# Patient Record
Sex: Female | Born: 1990 | Race: Black or African American | Hispanic: No | Marital: Single | Smoking: Former smoker
Health system: Southern US, Community
[De-identification: ages and names within clinical notes are randomized; demographics above are authoritative.]

## PROBLEM LIST (undated history)

## (undated) ENCOUNTER — Inpatient Hospital Stay (HOSPITAL_COMMUNITY): Payer: Self-pay

## (undated) DIAGNOSIS — I10 Essential (primary) hypertension: Secondary | ICD-10-CM

## (undated) DIAGNOSIS — D649 Anemia, unspecified: Secondary | ICD-10-CM

## (undated) DIAGNOSIS — F418 Other specified anxiety disorders: Secondary | ICD-10-CM

## (undated) DIAGNOSIS — A549 Gonococcal infection, unspecified: Secondary | ICD-10-CM

## (undated) DIAGNOSIS — F909 Attention-deficit hyperactivity disorder, unspecified type: Secondary | ICD-10-CM

## (undated) DIAGNOSIS — J45909 Unspecified asthma, uncomplicated: Secondary | ICD-10-CM

## (undated) DIAGNOSIS — F419 Anxiety disorder, unspecified: Secondary | ICD-10-CM

## (undated) DIAGNOSIS — A749 Chlamydial infection, unspecified: Secondary | ICD-10-CM

## (undated) HISTORY — PX: WISDOM TOOTH EXTRACTION: SHX21

---

## 2013-06-25 ENCOUNTER — Emergency Department (HOSPITAL_COMMUNITY): Payer: Medicaid Other

## 2013-06-25 ENCOUNTER — Emergency Department (HOSPITAL_COMMUNITY)
Admission: EM | Admit: 2013-06-25 | Discharge: 2013-06-25 | Disposition: A | Discharge status: Home / Self Care | Payer: Medicaid Other | Attending: Emergency Medicine | Admitting: Emergency Medicine

## 2013-06-25 ENCOUNTER — Encounter (HOSPITAL_COMMUNITY): Payer: Self-pay | Admitting: *Deleted

## 2013-06-25 DIAGNOSIS — Y92009 Unspecified place in unspecified non-institutional (private) residence as the place of occurrence of the external cause: Secondary | ICD-10-CM | POA: Insufficient documentation

## 2013-06-25 DIAGNOSIS — M25522 Pain in left elbow: Secondary | ICD-10-CM

## 2013-06-25 DIAGNOSIS — S59909A Unspecified injury of unspecified elbow, initial encounter: Secondary | ICD-10-CM | POA: Insufficient documentation

## 2013-06-25 DIAGNOSIS — Z79899 Other long term (current) drug therapy: Secondary | ICD-10-CM | POA: Insufficient documentation

## 2013-06-25 DIAGNOSIS — R209 Unspecified disturbances of skin sensation: Secondary | ICD-10-CM | POA: Insufficient documentation

## 2013-06-25 DIAGNOSIS — S6990XA Unspecified injury of unspecified wrist, hand and finger(s), initial encounter: Secondary | ICD-10-CM | POA: Insufficient documentation

## 2013-06-25 DIAGNOSIS — J45909 Unspecified asthma, uncomplicated: Secondary | ICD-10-CM | POA: Insufficient documentation

## 2013-06-25 DIAGNOSIS — Y93E1 Activity, personal bathing and showering: Secondary | ICD-10-CM | POA: Insufficient documentation

## 2013-06-25 DIAGNOSIS — W010XXA Fall on same level from slipping, tripping and stumbling without subsequent striking against object, initial encounter: Secondary | ICD-10-CM | POA: Insufficient documentation

## 2013-06-25 HISTORY — DX: Unspecified asthma, uncomplicated: J45.909

## 2013-06-25 MED ORDER — NAPROXEN 500 MG PO TABS: 500.0000 mg | ORAL_TABLET | Freq: Two times a day (BID) | ORAL | Status: DC

## 2013-06-25 MED ORDER — OXYCODONE-ACETAMINOPHEN 5-325 MG PO TABS
1.0000 | ORAL_TABLET | Freq: Once | ORAL | Status: AC
  Administered 2013-06-25: 1 via ORAL
  Filled 2013-06-25: qty 1

## 2013-06-25 NOTE — ED Provider Notes (Signed)
CSN: [id number]     Arrival date & time 06/25/13  1848 History  This chart was scribed for non-physician practitioner Raymon Mutton, PA-C working with Richardean Canal, MD by Danella Maiers, ED Scribe. This patient was seen in room TR04C/TR04C and the patient's care was started at 7:17 PM.    Chief Complaint  Patient presents with  . Arm Pain  . Fall   Patient is a 22 y.o. female presenting with fall. The history is provided by the patient. No language interpreter was used.  Fall Pertinent negatives include no headaches.   HPI Comments: Kristin Bentley is a 22 y.o. female who presents to the Emergency Department complaining of throbbing left elbow pain that radiates to her wrist after falling and hitting it in the shower. She denies hitting her head or LOC. She reports throbbing pain at rest that worsens with range of motion of the elbow. She reports tingling in her fingers. She denies blurred vision, neck pain, neck stiffness, back pain, headaches, dizziness, nausea, and numbness.   Past Medical History  Diagnosis Date  . Asthma    History reviewed. No pertinent past surgical history. No family history on file. History  Substance Use Topics  . Smoking status: Never Smoker   . Smokeless tobacco: Not on file  . Alcohol Use: No   OB History   Grav Para Term Preterm Abortions TAB SAB Ect Mult Living                 Review of Systems  Musculoskeletal: Positive for arthralgias (left elbow pain/injury).  Neurological: Negative for syncope and headaches.  All other systems reviewed and are negative.  Physical exam: Patient sitting comfortably upright in chair with blanket covered over her Neck supple, full range of motion-negative neck stiffness, negative nuchal rigidity, negative pain upon palpation to cervical spine Eyes normal, PERRLA bilaterally Heart rate and rhythm and normal, radial pulses 2+ bilaterally Lungs clear to auscultation to upper and lower lobes  bilaterally Negative swelling, erythema, inflammation, deformity noted to the left upper extremity. Discomfort upon palpation to the left olecranon process and antecubital fossa region. Negative warmth upon palpation. Negative erythema noted to the left elbow. Negative ecchymosis noted to the left elbow. Decreased flexion and extension at the left elbow secondary to pain. Pain upon palpation to the left humerus. Full range of motion to the left wrist, left digits, left shoulder. Patient able to make a fist. Sensation intact to upper extremities bilaterally with differentiation to sharp and dull touch. Strength 5+/5+ to right upper extremity, mildly decreased on the left upper extremity secondary to discomfort.   Allergies  Citrus  Home Medications   Current Outpatient Rx  Name  Route  Sig  Dispense  Refill  . guanFACINE (INTUNIV) 1 MG TB24   Oral   Take 1 mg by mouth daily.         . norgestimate-ethinyl estradiol (ORTHO-CYCLEN,SPRINTEC,PREVIFEM) 0.25-35 MG-MCG tablet   Oral   Take 1 tablet by mouth daily.         . naproxen (NAPROSYN) 500 MG tablet   Oral   Take 1 tablet (500 mg total) by mouth 2 (two) times daily.   30 tablet   0    BP 137/90  Pulse 115  Temp(Src) 99 F (37.2 C) (Oral)  Resp 18  Ht 5\' 7"  (1.702 m)  Wt 230 lb (104.327 kg)  BMI 36.01 kg/m2  SpO2 97%  LMP 05/25/2013 Physical Exam  ED Course  Procedures (  including critical care time)  Medications  oxyCODONE-acetaminophen (PERCOCET/ROXICET) 5-325 MG per tablet 1 tablet (1 tablet Oral Given 06/25/13 1902)   DIAGNOSTIC STUDIES: Oxygen Saturation is 97% on room air, normal by my interpretation.    COORDINATION OF CARE: 8:42 PM- Discussed treatment plan with pt which includes administering a sling, giving antibiotics, and an orthopedist referral and pt agrees to plan.    Labs Review Labs Reviewed - No data to display Imaging Review Dg Elbow Complete Left  06/25/2013   *RADIOLOGY REPORT*  Clinical  Data: Post fall, now with left elbow pain  LEFT ELBOW - COMPLETE 3+ VIEW  Comparison: None.  Findings: No displaced fracture or elbow joint effusion.  Joint spaces are preserved.  No erosions.  Regional soft tissues are normal.  No radiopaque foreign body.  IMPRESSION: Normal radiographs of the left elbow.   Original Report Authenticated By: Tacey Ruiz, MD   Dg Humerus Left  06/25/2013   *RADIOLOGY REPORT*  Clinical Data: Larey Seat in bathtub today with left arm pain  LEFT HUMERUS - 2+ VIEW  Comparison: None.  Findings: No fracture.  No bone lesion.  The elbow and shoulder joints normally spaced and aligned.  The soft tissues are unremarkable.  IMPRESSION: Normal left humerus radiographs.   Original Report Authenticated By: Amie Portland, M.D.    MDM   1. Left elbow pain    I personally performed the services described in this documentation, which was scribed in my presence. The recorded information has been reviewed and is accurate.  Patient presenting to emergency department with left elbow and left humerus pain after falling and hitting her elbow into her shower today. Alert and oriented. Patient able to produce a cyst of the left hand. Patient able to flex and extend the left elbow. Decreased supination and pronation secondary to pain the left arm. Strength intact and equally distribute. Sensation intact to left upper extremity with differentiation to sharp and dull touch. Pulses palpable. Negative deformities, erythema, inflammation noted to the left upper extremity. Imaging of the left elbow and left humerus negative for acute injury, no fractures or dislocations noted. Patient stable, afebrile. Pain controlled in ED setting. Discharged patient with anti-inflammatories. Patient placed in sling for comfort. Referred patient to orthopedics. Discussed with patient to avoid any strenuous activity. Discussed the patient to ice and elevate throughout the day. Discussed with patient to continue to monitor  symptoms and if symptoms are to worsen or change report back to emergency department-strict return instructions given. Patient with plan of care, understood, all questions answered.   Raymon Mutton, PA-C 06/26/13 1332

## 2013-06-25 NOTE — ED Notes (Signed)
Pt slipped while getting in shower and took impact to L elbow.  Denies hitting head and denies loc.  L elbow swollen and warm to touch.

## 2013-06-28 NOTE — ED Provider Notes (Signed)
Medical screening examination/treatment/procedure(s) were performed by non-physician practitioner and as supervising physician I was immediately available for consultation/collaboration.   Josphine Laffey H Kaitlyn Skowron, MD 06/28/13 2134 

## 2013-07-28 ENCOUNTER — Ambulatory Visit: Payer: Medicaid Other | Attending: Otolaryngology | Admitting: Audiology

## 2013-07-28 DIAGNOSIS — H93299 Other abnormal auditory perceptions, unspecified ear: Secondary | ICD-10-CM

## 2013-07-28 DIAGNOSIS — H9193 Unspecified hearing loss, bilateral: Secondary | ICD-10-CM

## 2013-07-28 DIAGNOSIS — H918X9 Other specified hearing loss, unspecified ear: Secondary | ICD-10-CM | POA: Insufficient documentation

## 2013-07-28 NOTE — Procedures (Signed)
[facility] [address] [place], [place] [address] [phone]  AUDIOLOGICAL EVALUATION  Name: Kristin Bentley DOB:  Mar 13, 1991 MRN:  [id number]    Diagnosis: Hearing Loss Right Ear Date: 07/28/2013    Referent: Dr. Narda Bonds  HISTORY: Kristin Bentley, age 22 y.o. years, was seen for an audiological evaluation and reports a "a gradual hearing loss with reduced word understanding loss in the right ear" over the past 4 yours.  She described a loud "scratching against the chalkboard" type ringing in the right ear" and "I have to stop what I am doing for a few minutes until it stops".  This sound happens day and night and occurs about 2-3 times per week and lasts about 30-40 minutes".  It makes the right side of her face "hurt".  Kristin Bentley reports a history of right sided ear infections as a young child.  Kristin Bentley also notices vertigo that "spins toward the left" and causes balance issues.  Kristin Bentley also reports feeling of "pressure in the ears" in one or both ears. She takes medicine for ADHD and pain medicine (for the past couple months for her right knee and teeth extraction".        EVALUATION: Pure tone air and tone conduction was completed using conventional audiometry and inserts. Hearing thresholds are 15 dBHL from 250Hz -1000Hz  and 10-15 dBHL from 2000Hz  - 8000Hz  except for a 20 dBHL threshold at 2000Hz  on the right side.  There seems to be a sensorineural component. Speech reception thresholds at 15 dBHL in each ear using recorded spondee words.  Word recognition is 100% in the right and 92% in the left at 50 dBHL, in quiet.  In minimal background noise (+5dB signal to noise ratio) word recognition drops to 70% on the right and 60% on the left.  Tympanometry shows normal middle ear function (Type A) with present ipsilateral acoustic reflexes of 80-85 dB.  Acoustic reflex decay is negative on the right side but questionable on the  left. Tone decay appears positive because Kristin Bentley consistently reported that the tone changed after 10-12 sec in either ear at 1000Hz  from 35-55 dBHL  Distortion Product Otoacoustic Emission (DPOAE) testing from 2000Hz  - 10,000Hz  was robust and present in each ear which supports good outer hair cell function in the cochlea.  Brainstem Auditory Evoked Response (BAER): Testing was performed using 11.01 clicks/sec presented to each ear separately through insert earphones. Waves I, III, and V showed good, symmetrical, waveform morphology at 70dB nHL in each ear.  The BAER is within normal limits.   CONCLUSION:      Kristin Bentley has a symmetrical slight low frequency hearing loss that improves to normal in the high frequencies in each ear.  There appears to be a sensorineural component and she has borderline uncomfortable loudness levels which may go along with very slight recruitment.  There is no evidence to support her complaint of right hear decreased hearing loss unless it fluctuates. Kristin Bentley has excellent word recognition in quiet that drops in minimal background noise to fair on the right and poor on the left in minimal background noise. Retrocochlear testing was positive for tone decay. Acoustic reflex decay was negative on the right side and questionable on the left with decay, but unseen movement could not be ruled out.  Brainstem Auditory Evoked Response testing was completed and was found to be symmetrical and within normal limits.Kristin Bentley appears to have good neural function through the brainstem.  As discussed with Kristin Bentley, she needs  to have a repeat test in 3-6 months to monitor her low frequency hearing loss; however, she did not want to make a follow-up appointment her today.   RECOMMENDATIONS: 1.   Monitor hearing closely with a repeat audiological evaluation in 3-6 months. Earlier if there is any change in hearing, ear pain or ear pressure. 2.   Follow-up with Dr. Ezzard Bentley,  ENT.   Kristin Bentley L. Kristin Bentley, Au.D., CCC-A Doctor of Audiology  07/28/2013  cc: Dr. Burton Bentley

## 2013-11-26 ENCOUNTER — Emergency Department (HOSPITAL_COMMUNITY): Payer: Medicaid Other

## 2013-11-26 ENCOUNTER — Encounter (HOSPITAL_COMMUNITY): Payer: Self-pay | Admitting: Emergency Medicine

## 2013-11-26 DIAGNOSIS — J45901 Unspecified asthma with (acute) exacerbation: Secondary | ICD-10-CM | POA: Insufficient documentation

## 2013-11-26 DIAGNOSIS — IMO0002 Reserved for concepts with insufficient information to code with codable children: Secondary | ICD-10-CM | POA: Insufficient documentation

## 2013-11-26 DIAGNOSIS — Z888 Allergy status to other drugs, medicaments and biological substances status: Secondary | ICD-10-CM | POA: Insufficient documentation

## 2013-11-26 MED ORDER — ALBUTEROL SULFATE (2.5 MG/3ML) 0.083% IN NEBU
5.0000 mg | INHALATION_SOLUTION | Freq: Once | RESPIRATORY_TRACT | Status: AC
  Administered 2013-11-26: 5 mg via RESPIRATORY_TRACT
  Filled 2013-11-26: qty 6

## 2013-11-26 NOTE — ED Notes (Signed)
Patient transported to X-ray 

## 2013-11-26 NOTE — ED Notes (Addendum)
Pt. reports asthma attack with productive cough / wheezing / chest tightness onset today , pt. stated that she ran out of her rescue inhaler .

## 2013-11-27 ENCOUNTER — Emergency Department (HOSPITAL_COMMUNITY)
Admission: EM | Admit: 2013-11-27 | Discharge: 2013-11-27 | Disposition: A | Discharge status: Home / Self Care | Payer: Medicaid Other | Attending: Emergency Medicine | Admitting: Emergency Medicine

## 2013-11-27 DIAGNOSIS — J45901 Unspecified asthma with (acute) exacerbation: Secondary | ICD-10-CM

## 2013-11-27 MED ORDER — PREDNISONE 20 MG PO TABS
60.0000 mg | ORAL_TABLET | Freq: Once | ORAL | Status: AC
  Administered 2013-11-27: 60 mg via ORAL
  Filled 2013-11-27: qty 3

## 2013-11-27 MED ORDER — PREDNISONE 50 MG PO TABS: ORAL_TABLET | ORAL | Status: DC

## 2013-11-27 MED ORDER — ALBUTEROL SULFATE HFA 108 (90 BASE) MCG/ACT IN AERS
2.0000 | INHALATION_SPRAY | Freq: Once | RESPIRATORY_TRACT | Status: AC
  Administered 2013-11-27: 2 via RESPIRATORY_TRACT
  Filled 2013-11-27: qty 6.7

## 2013-11-27 MED ORDER — ALBUTEROL SULFATE HFA 108 (90 BASE) MCG/ACT IN AERS: 1.0000 | INHALATION_SPRAY | Freq: Four times a day (QID) | RESPIRATORY_TRACT | Status: DC | PRN

## 2013-11-27 NOTE — ED Provider Notes (Signed)
CSN: [id number]     Arrival date & time 11/26/13  2151 History   First MD Initiated Contact with Patient 11/27/13 0114     Chief Complaint  Patient presents with  . Asthma  . Cough    Patient is a 23 y.o. female presenting with asthma and cough. The history is provided by the patient.  Asthma This is a recurrent problem. The current episode started 12 to 24 hours ago. The problem occurs constantly. The problem has been gradually improving (improving after nebs). Associated symptoms include chest pain and shortness of breath. Pertinent negatives include no abdominal pain. Nothing aggravates the symptoms. Relieved by: nebulizer.  Cough Associated symptoms: chest pain, chills and shortness of breath   pt reports she is having cough/wheeze and chest tightness due to asthma She reports h/o asthma She denies h/o ICU admissions for asthma She reports ran out of her home inhaler, but she received nebulizer in the ER and is feeling improved  Past Medical History  Diagnosis Date  . Asthma    History reviewed. No pertinent past surgical history. No family history on file. History  Substance Use Topics  . Smoking status: Never Smoker   . Smokeless tobacco: Not on file  . Alcohol Use: No   OB History   Grav Para Term Preterm Abortions TAB SAB Ect Mult Living                 Review of Systems  Constitutional: Positive for chills.  Respiratory: Positive for cough and shortness of breath.   Cardiovascular: Positive for chest pain.  Gastrointestinal: Negative for abdominal pain.  Neurological: Negative for weakness.  All other systems reviewed and are negative.    Allergies  Citrus  Home Medications   Current Outpatient Rx  Name  Route  Sig  Dispense  Refill  . guanFACINE (INTUNIV) 1 MG TB24   Oral   Take 1 mg by mouth daily.         . norgestimate-ethinyl estradiol (ORTHO-CYCLEN,SPRINTEC,PREVIFEM) 0.25-35 MG-MCG tablet   Oral   Take 1 tablet by mouth daily.         Marland Kitchen.  albuterol (PROVENTIL HFA;VENTOLIN HFA) 108 (90 BASE) MCG/ACT inhaler   Inhalation   Inhale 1-2 puffs into the lungs every 6 (six) hours as needed for wheezing or shortness of breath.   1 Inhaler   0   . predniSONE (DELTASONE) 50 MG tablet      One tablet PO daily for 4 days   4 tablet   0    BP 125/76  Pulse 93  Temp(Src) 98.6 F (37 C) (Oral)  Resp 20  SpO2 100%  LMP 10/27/2013 Physical Exam CONSTITUTIONAL: Well developed/well nourished HEAD: Normocephalic/atraumatic EYES: EOMI/PERRL ENMT: Mucous membranes moist NECK: supple no meningeal signs SPINE:entire spine nontender CV: S1/S2 noted, no murmurs/rubs/gallops noted Chest - mild tenderness to palpation of chest wall LUNGS: Lungs are clear to auscultation bilaterally, no apparent distress ABDOMEN: soft, nontender, no rebound or guarding GU:no cva tenderness NEURO: Pt is awake/alert, moves all extremitiesx4 EXTREMITIES: pulses normal, full ROM, no LE edema noted SKIN: warm, color normal PSYCH: no abnormalities of mood noted  ED Course  Procedures (including critical care time)  By the time of my evaluation, pt had received albuterol and she felt improved I feel she is safe/stable for d/c home  Labs Review Labs Reviewed - No data to display Imaging Review Dg Chest 2 View  11/26/2013   CLINICAL DATA:  Shortness of breath,  asthma  EXAM: CHEST  2 VIEW  COMPARISON:  None.  FINDINGS: The heart size and mediastinal contours are within normal limits. Both lungs are clear. The visualized skeletal structures are unremarkable.  IMPRESSION: No active cardiopulmonary disease.   Electronically Signed   By: Ruel Favors M.D.   On: 11/26/2013 23:02    EKG Interpretation    Date/Time:  Thursday November 26 2013 21:59:03 EST Ventricular Rate:  95 PR Interval:  164 QRS Duration: 86 QT Interval:  346 QTC Calculation: 434 R Axis:   61 Text Interpretation:  Normal sinus rhythm Normal ECG Confirmed by Bebe Shaggy  MD, Chandria Rookstool ([id number])  on 11/27/2013 1:21:18 AM            MDM   1. Asthma attack    Nursing notes including past medical history and social history reviewed and considered in documentation xrays reviewed and considered     Joya Gaskins, MD 11/27/13 [id number]

## 2013-11-27 NOTE — Discharge Instructions (Signed)
Asthma, Adult Asthma is a recurring condition in which the airways tighten and narrow. Asthma can make it difficult to breathe. It can cause coughing, wheezing, and shortness of breath. Asthma episodes (also called asthma attacks) range from minor to life-threatening. Asthma cannot be cured, but medicines and lifestyle changes can help control it. CAUSES Asthma is believed to be caused by inherited (genetic) and environmental factors, but its exact cause is unknown. Asthma may be triggered by allergens, lung infections, or irritants in the air. Asthma triggers are different for each person. Common triggers include:   Animal dander.  Dust mites.  Cockroaches.  Pollen from trees or grass.  Mold.  Smoke.  Air pollutants such as dust, household cleaners, hair sprays, aerosol sprays, paint fumes, strong chemicals, or strong odors.  Cold air, weather changes, and winds (which increase molds and pollens in the air).  Strong emotional expressions such as crying or laughing hard.  Stress.  Certain medicines (such as aspirin) or types of drugs (such as beta-blockers).  Sulfites in foods and drinks. Foods and drinks that may contain sulfites include dried fruit, potato chips, and sparkling grape juice.  Infections or inflammatory conditions such as the flu, a cold, or an inflammation of the nasal membranes (rhinitis).  Gastroesophageal reflux disease (GERD).  Exercise or strenuous activity. SYMPTOMS Symptoms may occur immediately after asthma is triggered or many hours later. Symptoms include:  Wheezing.  Excessive nighttime or early morning coughing.  Frequent or severe coughing with a common cold.  Chest tightness.  Shortness of breath. DIAGNOSIS  The diagnosis of asthma is made by a review of your medical history and a physical exam. Tests may also be performed. These may include:  Lung function studies. These tests show how much air you breath in and out.  Allergy  tests.  Imaging tests such as X-rays. TREATMENT  Asthma cannot be cured, but it can usually be controlled. Treatment involves identifying and avoiding your asthma triggers. It also involves medicines. There are 2 classes of medicine used for asthma treatment:   Controller medicines. These prevent asthma symptoms from occurring. They are usually taken every day.  Reliever or rescue medicines. These quickly relieve asthma symptoms. They are used as needed and provide short-term relief. Your health care provider will help you create an asthma action plan. An asthma action plan is a written plan for managing and treating your asthma attacks. It includes a list of your asthma triggers and how they may be avoided. It also includes information on when medicines should be taken and when their dosage should be changed. An action plan may also involve the use of a device called a peak flow meter. A peak flow meter measures how well the lungs are working. It helps you monitor your condition. HOME CARE INSTRUCTIONS   Take medicine as directed by your health care provider. Speak with your health care provider if you have questions about how or when to take the medicines.  Use a peak flow meter as directed by your health care provider. Record and keep track of readings.  Understand and use the action plan to help minimize or stop an asthma attack without needing to seek medical care.  Control your home environment in the following ways to help prevent asthma attacks:  Do not smoke. Avoid being exposed to secondhand smoke.  Change your heating and air conditioning filter regularly.  Limit your use of fireplaces and wood stoves.  Get rid of pests (such as roaches and   mice) and their droppings.  Throw away plants if you see mold on them.  Clean your floors and dust regularly. Use unscented cleaning products.  Try to have someone else vacuum for you regularly. Stay out of rooms while they are being  vacuumed and for a short while afterward. If you vacuum, use a dust mask from a hardware store, a double-layered or microfilter vacuum cleaner bag, or a vacuum cleaner with a HEPA filter.  Replace carpet with wood, tile, or vinyl flooring. Carpet can trap dander and dust.  Use allergy-proof pillows, mattress covers, and box spring covers.  Wash bed sheets and blankets every week in hot water and dry them in a dryer.  Use blankets that are made of polyester or cotton.  Clean bathrooms and kitchens with bleach. If possible, have someone repaint the walls in these rooms with mold-resistant paint. Keep out of the rooms that are being cleaned and painted.  Wash hands frequently. SEEK MEDICAL CARE IF:   You have wheezing, shortness of breath, or a cough even if taking medicine to prevent attacks.  The colored mucus you cough up (sputum) is thicker than usual.  Your sputum changes from clear or white to yellow, green, gray, or bloody.  You have any problems that may be related to the medicines you are taking (such as a rash, itching, swelling, or trouble breathing).  You are using a reliever medicine more than 2 3 times per week.  Your peak flow is still at 50 79% of you personal best after following your action plan for 1 hour. SEEK IMMEDIATE MEDICAL CARE IF:   You seem to be getting worse and are unresponsive to treatment during an asthma attack.  You are short of breath even at rest.  You get short of breath when doing very little physical activity.  You have difficulty eating, drinking, or talking due to asthma symptoms.  You develop chest pain.  You develop a fast heartbeat.  You have a bluish color to your lips or fingernails.  You are lightheaded, dizzy, or faint.  Your peak flow is less than 50% of your personal best.  You have a fever or persistent symptoms for more than 2 3 days.  You have a fever and symptoms suddenly get worse. MAKE SURE YOU:   Understand these  instructions.  Will watch your condition.  Will get help right away if you are not doing well or get worse. Document Released: 10/15/2005 Document Revised: 06/17/2013 Document Reviewed: 05/14/2013 [employer] Patient Information 2014 [employer].  

## 2013-12-16 ENCOUNTER — Emergency Department (HOSPITAL_COMMUNITY): Payer: Medicaid Other

## 2013-12-16 ENCOUNTER — Encounter (HOSPITAL_COMMUNITY): Payer: Self-pay | Admitting: Emergency Medicine

## 2013-12-16 ENCOUNTER — Emergency Department (HOSPITAL_COMMUNITY)
Admission: EM | Admit: 2013-12-16 | Discharge: 2013-12-16 | Disposition: A | Discharge status: Home / Self Care | Payer: Medicaid Other | Attending: Emergency Medicine | Admitting: Emergency Medicine

## 2013-12-16 DIAGNOSIS — Y929 Unspecified place or not applicable: Secondary | ICD-10-CM | POA: Insufficient documentation

## 2013-12-16 DIAGNOSIS — W19XXXA Unspecified fall, initial encounter: Secondary | ICD-10-CM

## 2013-12-16 DIAGNOSIS — M549 Dorsalgia, unspecified: Secondary | ICD-10-CM

## 2013-12-16 DIAGNOSIS — J45909 Unspecified asthma, uncomplicated: Secondary | ICD-10-CM | POA: Insufficient documentation

## 2013-12-16 DIAGNOSIS — W010XXA Fall on same level from slipping, tripping and stumbling without subsequent striking against object, initial encounter: Secondary | ICD-10-CM | POA: Insufficient documentation

## 2013-12-16 DIAGNOSIS — Z8659 Personal history of other mental and behavioral disorders: Secondary | ICD-10-CM | POA: Insufficient documentation

## 2013-12-16 DIAGNOSIS — Z79899 Other long term (current) drug therapy: Secondary | ICD-10-CM | POA: Insufficient documentation

## 2013-12-16 DIAGNOSIS — Y939 Activity, unspecified: Secondary | ICD-10-CM | POA: Insufficient documentation

## 2013-12-16 DIAGNOSIS — IMO0002 Reserved for concepts with insufficient information to code with codable children: Secondary | ICD-10-CM | POA: Insufficient documentation

## 2013-12-16 HISTORY — DX: Attention-deficit hyperactivity disorder, unspecified type: F90.9

## 2013-12-16 MED ORDER — IBUPROFEN 400 MG PO TABS
400.0000 mg | ORAL_TABLET | Freq: Once | ORAL | Status: DC
  Filled 2013-12-16: qty 1

## 2013-12-16 MED ORDER — IBUPROFEN 800 MG PO TABS: 800.0000 mg | ORAL_TABLET | Freq: Three times a day (TID) | ORAL | Status: DC | PRN

## 2013-12-16 MED ORDER — IBUPROFEN 400 MG PO TABS
800.0000 mg | ORAL_TABLET | Freq: Once | ORAL | Status: AC
  Administered 2013-12-16: 800 mg via ORAL
  Filled 2013-12-16: qty 2

## 2013-12-16 NOTE — ED Provider Notes (Signed)
CSN: [id number]     Arrival date & time 12/16/13  1140 History  This chart was scribed for non-physician practitioner, Trixie Dredge, PA-C working with Lyanne Co, MD by Greggory Stallion, ED scribe. This patient was seen in room TR06C/TR06C and the patient's care was started at 2:15 PM.   Chief Complaint  Patient presents with  . Fall  . Arm Pain   The history is provided by the patient. No language interpreter was used.   HPI Comments: Kristin Bentley is a 23 y.o. female who presents to the Emergency Department complaining of a fall that occurred this morning. Pt slipped on ice and fell with her arm outstretched. Denies hitting her head or LOC. She has sudden onset right shoulder pain that radiates down her arm. Rates the pain 9/10. Certain movements worsen the pain. Pt also has mild mid back pain. Denies chest pain, neck pain, numbness, weakness.    Past Medical History  Diagnosis Date  . Asthma   . ADHD (attention deficit hyperactivity disorder)    History reviewed. No pertinent past surgical history. History reviewed. No pertinent family history. History  Substance Use Topics  . Smoking status: Never Smoker   . Smokeless tobacco: Not on file  . Alcohol Use: No   OB History   Grav Para Term Preterm Abortions TAB SAB Ect Mult Living                 Review of Systems  Cardiovascular: Negative for chest pain.  Musculoskeletal: Positive for arthralgias, back pain and myalgias. Negative for neck pain.  Neurological: Negative for weakness and numbness.  All other systems reviewed and are negative.   Allergies  Bee venom and Citrus  Home Medications   Current Outpatient Rx  Name  Route  Sig  Dispense  Refill  . albuterol (PROVENTIL HFA;VENTOLIN HFA) 108 (90 BASE) MCG/ACT inhaler   Inhalation   Inhale 1-2 puffs into the lungs every 6 (six) hours as needed for wheezing or shortness of breath.   1 Inhaler   0   . EPINEPHrine (EPIPEN) 0.3 mg/0.3 mL SOAJ injection  Intramuscular   Inject 0.3 mg into the muscle as needed (for reaction).         Marland Kitchen guanFACINE (INTUNIV) 1 MG TB24   Oral   Take 1 mg by mouth daily.         . norgestimate-ethinyl estradiol (ORTHO-CYCLEN,SPRINTEC,PREVIFEM) 0.25-35 MG-MCG tablet   Oral   Take 1 tablet by mouth daily.          BP 138/77  Pulse 90  Temp(Src) 98 F (36.7 C) (Oral)  Resp 17  Wt 235 lb (106.595 kg)  SpO2 100%  LMP 10/27/2013  Physical Exam  Nursing note and vitals reviewed. Constitutional: She appears well-developed and well-nourished. No distress.  HENT:  Head: Normocephalic and atraumatic.  Neck: Neck supple.  Cardiovascular: Normal rate, regular rhythm and normal heart sounds.   Pulmonary/Chest: Effort normal and breath sounds normal. No respiratory distress. She has no wheezes. She has no rales. She exhibits no tenderness.  Musculoskeletal:       Back:  Tenderness to upper thoracic spine. No C-spine tenderness. Right upper back tenderness.   Upper extremities:  Strength 5/5, sensation intact, distal pulses intact.     Neurological: She is alert.  Skin: She is not diaphoretic.    ED Course  Procedures (including critical care time)  DIAGNOSTIC STUDIES: Oxygen Saturation is 100% on RA, normal by my interpretation.  COORDINATION OF CARE: 2:17 PM-Discussed treatment plan which includes xray with pt at bedside and pt agreed to plan.   Labs Review Labs Reviewed - No data to display Imaging Review Dg Thoracic Spine 2 View  12/16/2013   CLINICAL DATA:  Thoracic pain post fall  EXAM: THORACIC SPINE - 2 VIEW  COMPARISON:  None.  FINDINGS: Three views of thoracic spine submitted. No acute fracture or subluxation. Alignment and vertebral heights are preserved.  IMPRESSION: Negative.   Electronically Signed   By: Natasha MeadLiviu  Pop M.D.   On: 12/16/2013 15:04    EKG Interpretation   None       MDM   Final diagnoses:  Fall  Upper back pain on right side   Pt with mechanical fall with  no head or neck injury.  Neurovascularly intact.  Right upper back tenderness and tenderness over T-spine without crepitus or stepoffs.  Xray negative. D/C home with ibuprofen. Discussed result, findings, treatment, and follow up  with patient.  Pt given return precautions.  Pt verbalizes understanding and agrees with plan.      I personally performed the services described in this documentation, which was scribed in my presence. The recorded information has been reviewed and is accurate.   Trixie Dredgemily Demica Zook, PA-C 12/16/13 1542

## 2013-12-16 NOTE — ED Notes (Signed)
Patient states fell on ice this morning when taking out the trash.   Patient states she stifled fall with R hand/arm.   Patient states pain from R shoulder to R hand.

## 2013-12-16 NOTE — ED Notes (Signed)
Pt c/o trip and fall today; pt c/o right arm pain all over since fall; CMS intact

## 2013-12-16 NOTE — ED Provider Notes (Signed)
Medical screening examination/treatment/procedure(s) were performed by non-physician practitioner and as supervising physician I was immediately available for consultation/collaboration.  EKG Interpretation   None         Lyanne CoKevin M French Kendra, MD 12/16/13 1701

## 2013-12-16 NOTE — Discharge Instructions (Signed)
Read the information below.  Use the prescribed medication as directed.  Please discuss all new medications with your pharmacist.  You may return to the Emergency Department at any time for worsening condition or any new symptoms that concern you.   If you develop fevers, loss of control of bowel or bladder, weakness or numbness in your legs, or are unable to walk, return to the ER for a recheck.  ° ° °Back Pain, Adult °Low back pain is very common. About 1 in 5 people have back pain. The cause of low back pain is rarely dangerous. The pain often gets better over time. About half of people with a sudden onset of back pain feel better in just 2 weeks. About 8 in 10 people feel better by 6 weeks.  °CAUSES °Some common causes of back pain include: °· Strain of the muscles or ligaments supporting the spine. °· Wear and tear (degeneration) of the spinal discs. °· Arthritis. °· Direct injury to the back. °DIAGNOSIS °Most of the time, the direct cause of low back pain is not known. However, back pain can be treated effectively even when the exact cause of the pain is unknown. Answering your caregiver's questions about your overall health and symptoms is one of the most accurate ways to make sure the cause of your pain is not dangerous. If your caregiver needs more information, he or she may order lab work or imaging tests (X-rays or MRIs). However, even if imaging tests show changes in your back, this usually does not require surgery. °HOME CARE INSTRUCTIONS °For many people, back pain returns. Since low back pain is rarely dangerous, it is often a condition that people can learn to manage on their own.  °· Remain active. It is stressful on the back to sit or stand in one place. Do not sit, drive, or stand in one place for more than 30 minutes at a time. Take short walks on level surfaces as soon as pain allows. Try to increase the length of time you walk each day. °· Do not stay in bed. Resting more than 1 or 2 days can  delay your recovery. °· Do not avoid exercise or work. Your body is made to move. It is not dangerous to be active, even though your back may hurt. Your back will likely heal faster if you return to being active before your pain is gone. °· Pay attention to your body when you  bend and lift. Many people have less discomfort when lifting if they bend their knees, keep the load close to their bodies, and avoid twisting. Often, the most comfortable positions are those that put less stress on your recovering back. °· Find a comfortable position to sleep. Use a firm mattress and lie on your side with your knees slightly bent. If you lie on your back, put a pillow under your knees. °· Only take over-the-counter or prescription medicines as directed by your caregiver. Over-the-counter medicines to reduce pain and inflammation are often the most helpful. Your caregiver may prescribe muscle relaxant drugs. These medicines help dull your pain so you can more quickly return to your normal activities and healthy exercise. °· Put ice on the injured area. °· Put ice in a plastic bag. °· Place a towel between your skin and the bag. °· Leave the ice on for 15-20 minutes, 03-04 times a day for the first 2 to 3 days. After that, ice and heat may be alternated to reduce pain and spasms. °· Ask your caregiver about trying back exercises and gentle massage. This may be of some   benefit. °· Avoid feeling anxious or stressed. Stress increases muscle tension and can worsen back pain. It is important to recognize when you are anxious or stressed and learn ways to manage it. Exercise is a great option. °SEEK MEDICAL CARE IF: °· You have pain that is not relieved with rest or medicine. °· You have pain that does not improve in 1 week. °· You have new symptoms. °· You are generally not feeling well. °SEEK IMMEDIATE MEDICAL CARE IF:  °· You have pain that radiates from your back into your legs. °· You develop new bowel or bladder control  problems. °· You have unusual weakness or numbness in your arms or legs. °· You develop nausea or vomiting. °· You develop abdominal pain. °· You feel faint. °Document Released: 10/15/2005 Document Revised: 04/15/2012 Document Reviewed: 03/05/2011 °Exit[employer]® Patient Information ©2014 [employer]. ° °

## 2014-01-04 ENCOUNTER — Emergency Department (HOSPITAL_COMMUNITY): Payer: Medicaid Other

## 2014-01-04 ENCOUNTER — Encounter (HOSPITAL_COMMUNITY): Payer: Self-pay | Admitting: Emergency Medicine

## 2014-01-04 ENCOUNTER — Emergency Department (HOSPITAL_COMMUNITY)
Admission: EM | Admit: 2014-01-04 | Discharge: 2014-01-04 | Disposition: A | Discharge status: Home / Self Care | Payer: Medicaid Other | Attending: Emergency Medicine | Admitting: Emergency Medicine

## 2014-01-04 DIAGNOSIS — I10 Essential (primary) hypertension: Secondary | ICD-10-CM | POA: Insufficient documentation

## 2014-01-04 DIAGNOSIS — Z8659 Personal history of other mental and behavioral disorders: Secondary | ICD-10-CM | POA: Insufficient documentation

## 2014-01-04 DIAGNOSIS — T7840XA Allergy, unspecified, initial encounter: Secondary | ICD-10-CM

## 2014-01-04 DIAGNOSIS — R600 Localized edema: Secondary | ICD-10-CM

## 2014-01-04 DIAGNOSIS — Z79899 Other long term (current) drug therapy: Secondary | ICD-10-CM | POA: Insufficient documentation

## 2014-01-04 DIAGNOSIS — R221 Localized swelling, mass and lump, neck: Principal | ICD-10-CM

## 2014-01-04 DIAGNOSIS — J45909 Unspecified asthma, uncomplicated: Secondary | ICD-10-CM | POA: Insufficient documentation

## 2014-01-04 DIAGNOSIS — R220 Localized swelling, mass and lump, head: Secondary | ICD-10-CM | POA: Insufficient documentation

## 2014-01-04 LAB — I-STAT CHEM 8, ED
BUN: 7 mg/dL (ref 6–23)
CHLORIDE: 102 meq/L (ref 96–112)
Calcium, Ion: 1.22 mmol/L (ref 1.12–1.23)
Creatinine, Ser: 0.8 mg/dL (ref 0.50–1.10)
Glucose, Bld: 86 mg/dL (ref 70–99)
HEMATOCRIT: 39 % (ref 36.0–46.0)
Hemoglobin: 13.3 g/dL (ref 12.0–15.0)
POTASSIUM: 3.8 meq/L (ref 3.7–5.3)
SODIUM: 142 meq/L (ref 137–147)
TCO2: 26 mmol/L (ref 0–100)

## 2014-01-04 MED ORDER — CLINDAMYCIN HCL 300 MG PO CAPS: 300.0000 mg | ORAL_CAPSULE | Freq: Four times a day (QID) | ORAL | Status: DC

## 2014-01-04 MED ORDER — PREDNISONE 20 MG PO TABS: ORAL_TABLET | ORAL | Status: DC

## 2014-01-04 MED ORDER — DEXAMETHASONE SODIUM PHOSPHATE 10 MG/ML IJ SOLN
10.0000 mg | Freq: Once | INTRAMUSCULAR | Status: AC
  Administered 2014-01-04: 10 mg via INTRAVENOUS
  Filled 2014-01-04: qty 1

## 2014-01-04 MED ORDER — DIPHENHYDRAMINE HCL 25 MG PO TABS: 25.0000 mg | ORAL_TABLET | Freq: Four times a day (QID) | ORAL | Status: DC

## 2014-01-04 MED ORDER — IOHEXOL 300 MG/ML  SOLN
80.0000 mL | Freq: Once | INTRAMUSCULAR | Status: AC | PRN
  Administered 2014-01-04: 80 mL via INTRAVENOUS

## 2014-01-04 NOTE — Discharge Instructions (Signed)
Take prednisone as prescribed beginning tomorrow as you were given your first dose in the emergency department today. Take benadryl every 6 hours as needed for itching. Return to the ED with worsening symptoms. If you develop fever or redness to the area, begin taking clindamycin.  Edema Edema is an abnormal build-up of fluids in tissues. Because this is partly dependent on gravity (water flows to the lowest place), it is more common in the legs and thighs (lower extremities). It is also common in the looser tissues, like around the eyes. Painless swelling of the feet and ankles is common and increases as a person ages. It may affect both legs and may include the calves or even thighs. When squeezed, the fluid may move out of the affected area and may leave a dent for a few moments. CAUSES   Prolonged standing or sitting in one place for extended periods of time. Movement helps pump tissue fluid into the veins, and absence of movement prevents this, resulting in edema.  Varicose veins. The valves in the veins do not work as well as they should. This causes fluid to leak into the tissues.  Fluid and salt overload.  Injury, burn, or surgery to the leg, ankle, or foot, may damage veins and allow fluid to leak out.  Sunburn damages vessels. Leaky vessels allow fluid to go out into the sunburned tissues.  Allergies (from insect bites or stings, medications or chemicals) cause swelling by allowing vessels to become leaky.  Protein in the blood helps keep fluid in your vessels. Low protein, as in malnutrition, allows fluid to leak out.  Hormonal changes, including pregnancy and menstruation, cause fluid retention. This fluid may leak out of vessels and cause edema.  Medications that cause fluid retention. Examples are sex hormones, blood pressure medications, steroid treatment, or anti-depressants.  Some illnesses cause edema, especially heart failure, kidney disease, or liver disease.  Surgery  that cuts veins or lymph nodes, such as surgery done for the heart or for breast cancer, may result in edema. DIAGNOSIS  Your caregiver is usually easily able to determine what is causing your swelling (edema) by simply asking what is wrong (getting a history) and examining you (doing a physical). Sometimes x-rays, EKG (electrocardiogram or heart tracing), and blood work may be done to evaluate for underlying medical illness. TREATMENT  General treatment includes:  Leg elevation (or elevation of the affected body part).  Restriction of fluid intake.  Prevention of fluid overload.  Compression of the affected body part. Compression with elastic bandages or support stockings squeezes the tissues, preventing fluid from entering and forcing it back into the blood vessels.  Diuretics (also called water pills or fluid pills) pull fluid out of your body in the form of increased urination. These are effective in reducing the swelling, but can have side effects and must be used only under your caregiver's supervision. Diuretics are appropriate only for some types of edema. The specific treatment can be directed at any underlying causes discovered. Heart, liver, or kidney disease should be treated appropriately. HOME CARE INSTRUCTIONS   Elevate the legs (or affected body part) above the level of the heart, while lying down.  Avoid sitting or standing still for prolonged periods of time.  Avoid putting anything directly under the knees when lying down, and do not wear constricting clothing or garters on the upper legs.  Exercising the legs causes the fluid to work back into the veins and lymphatic channels. This may help the  swelling go down.  The pressure applied by elastic bandages or support stockings can help reduce ankle swelling.  A low-salt diet may help reduce fluid retention and decrease the ankle swelling.  Take any medications exactly as prescribed. SEEK MEDICAL CARE IF:  Your edema  is not responding to recommended treatments. SEEK IMMEDIATE MEDICAL CARE IF:   You develop shortness of breath or chest pain.  You cannot breathe when you lay down; or if, while lying down, you have to get up and go to the window to get your breath.  You are having increasing swelling without relief from treatment.  You develop a fever over 102 F (38.9 C).  You develop pain or redness in the areas that are swollen.  Tell your caregiver right away if you have gained 03 lb/1.4 kg in 1 day or 05 lb/2.3 kg in a week. MAKE SURE YOU:   Understand these instructions.  Will watch your condition.  Will get help right away if you are not doing well or get worse. Document Released: 10/15/2005 Document Revised: 04/15/2012 Document Reviewed: 06/02/2008 [facility]ExitCare Patient Information 2014 [place], [place].

## 2014-01-04 NOTE — ED Provider Notes (Signed)
CSN: [id number]     Arrival date & time 01/04/14  0958 History  This chart was scribed for non-physician practitioner, Kristin Gourdobyn Albert, PA-C working with Glynn OctaveStephen Rancour, MD by Greggory StallionKayla Andersen, ED scribe. This patient was seen in room TR06C/TR06C and the patient's care was started at 10:25 AM.    Chief Complaint  Patient presents with  . Insect Bite   The history is provided by the patient. No language interpreter was used.   HPI Comments: Kristin Bentley is a 23 y.o. female who presents to the Emergency Department complaining of a possible insect bite to her right cheek that she noticed 3 days ago. She states the area started itching then she woke up yesterday with swelling to the right side of her face. Pt states the area is tender to touch. Denies fever, eye pain.   Past Medical History  Diagnosis Date  . Asthma   . ADHD (attention deficit hyperactivity disorder)    History reviewed. No pertinent past surgical history. No family history on file. History  Substance Use Topics  . Smoking status: Never Smoker   . Smokeless tobacco: Not on file  . Alcohol Use: No   OB History   Grav Para Term Preterm Abortions TAB SAB Ect Mult Living                 Review of Systems  Constitutional: Negative for fever.  HENT: Positive for facial swelling.   Eyes: Negative for pain.  All other systems reviewed and are negative.   Allergies  Bee venom and Citrus  Home Medications   Current Outpatient Rx  Name  Route  Sig  Dispense  Refill  . albuterol (PROVENTIL HFA;VENTOLIN HFA) 108 (90 BASE) MCG/ACT inhaler   Inhalation   Inhale 1-2 puffs into the lungs every 6 (six) hours as needed for wheezing or shortness of breath.   1 Inhaler   0   . guanFACINE (INTUNIV) 1 MG TB24   Oral   Take 1 mg by mouth daily.         Marland Kitchen. ibuprofen (ADVIL,MOTRIN) 800 MG tablet   Oral   Take 1 tablet (800 mg total) by mouth every 8 (eight) hours as needed for mild pain or moderate pain.   15 tablet    0   . norgestimate-ethinyl estradiol (ORTHO-CYCLEN,SPRINTEC,PREVIFEM) 0.25-35 MG-MCG tablet   Oral   Take 1 tablet by mouth daily.         . clindamycin (CLEOCIN) 300 MG capsule   Oral   Take 1 capsule (300 mg total) by mouth 4 (four) times daily. X 7 days   28 capsule   0   . diphenhydrAMINE (BENADRYL) 25 MG tablet   Oral   Take 1 tablet (25 mg total) by mouth every 6 (six) hours.   10 tablet   0   . EPINEPHrine (EPIPEN) 0.3 mg/0.3 mL SOAJ injection   Intramuscular   Inject 0.3 mg into the muscle as needed (for reaction).         . predniSONE (DELTASONE) 20 MG tablet      2 tabs po daily x 4 days   8 tablet   0    BP 130/78  Pulse 85  Temp(Src) 98.4 F (36.9 C) (Oral)  SpO2 100%  LMP 12/04/2013  Physical Exam  Nursing note and vitals reviewed. Constitutional: She is oriented to person, place, and time. She appears well-developed and well-nourished. No distress.  HENT:  Head: Normocephalic and atraumatic.  Mouth/Throat:  Oropharynx is clear and moist.  Significant right sided facial swelling from mid cheek to lower eyelid. Deep area of induration. No erythema. Skin is intact. No gingival swelling. Soft palate normal.  Eyes: Conjunctivae and EOM are normal.  No pain with eye movements.   Neck: Normal range of motion. Neck supple.  Cardiovascular: Normal rate, regular rhythm and normal heart sounds.   Pulmonary/Chest: Effort normal and breath sounds normal. No respiratory distress.  Musculoskeletal: Normal range of motion. She exhibits no edema.  Lymphadenopathy:    She has no cervical adenopathy.  Neurological: She is alert and oriented to person, place, and time. No sensory deficit.  Skin: Skin is warm and dry.  Psychiatric: She has a normal mood and affect. Her behavior is normal.    ED Course  Procedures (including critical care time)  DIAGNOSTIC STUDIES: Oxygen Saturation is 100% on RA, normal by my interpretation.    COORDINATION OF CARE: 10:28  AM-Discussed treatment plan which includes CT scan with pt at bedside and pt agreed to plan.   Labs Review Labs Reviewed  I-STAT CHEM 8, ED   Imaging Review Ct Maxillofacial W/cm  01/04/2014   CLINICAL DATA:  Insect bite to right cheek 3 days ago with swelling and redness.  EXAM: CT MAXILLOFACIAL WITH CONTRAST  TECHNIQUE: Multidetector CT imaging of the maxillofacial structures was performed with intravenous contrast. Multiplanar CT image reconstructions were also generated. A small metallic BB was placed on the right temple in order to reliably differentiate right from left.  CONTRAST:  80mL OMNIPAQUE IOHEXOL 300 MG/ML  SOLN  COMPARISON:  None.  FINDINGS: Examination demonstrates mild subcutaneous edema over the right infraorbital region and mid face. No definite subcutaneous abscess. Orbits are normal and symmetric. Retrobulbar spaces are normal. Paranasal sinuses are clear. There is a concha bullosa of the right middle turbinate. Ostiomeatal complexes are patent. Remaining bones and soft tissues are within normal.  IMPRESSION: Mild subcutaneous edema over the right infraorbital region and mid face. No evidence of underlying subcutaneous abscess.   Electronically Signed   By: Elberta Fortis M.D.   On: 01/04/2014 11:34     EKG Interpretation None      MDM   Final diagnoses:  Facial edema  Allergic reaction    Patient presenting with right-sided facial swelling. She appears in no apparent distress, afebrile, stable vital signs. Extraocular movements intact, no pain with eye movements. No definite bite evident insect bite. No intraoral involvement. No thought of possible deep abscess, CT maxillofacial with contrast obtained, no abscess seen, however mild subcutaneous edema over the right infraorbital region and face present. No erythema or warmth concerning for cellulitis at this time. No respiratory or airway compromise. Will treat as an allergic reaction with steroids and antihistamines.  Prescription for clindamycin given if patient develops any redness or fever. She has an appointment with her primary care physician in 2 days and will followup at that time. Stable for discharge. Return precautions given. Patient states understanding of treatment care plan and is agreeable. Case discussed with attending Dr. Manus Gunning who also evaluated patient and agrees with plan of care.   I personally performed the services described in this documentation, which was scribed in my presence. The recorded information has been reviewed and is accurate.   Trevor Mace, PA-C 01/04/14 1154

## 2014-01-04 NOTE — ED Notes (Signed)
States woke Sat am with red, itching "bite" to right cheek. States woke yesterday am with right periorbital and right cheek swelling. States it itches.

## 2014-01-04 NOTE — ED Provider Notes (Signed)
Medical screening examination/treatment/procedure(s) were conducted as a shared visit with non-physician practitioner(s) and myself.  I personally evaluated the patient during the encounter.  R cheek swelling x 3 days. No heat or erythema.  No dental pain, no difficulty breathing or swallowing. Uvula midline, tolerating secretions. No pain with EOMI.   EKG Interpretation None       Glynn OctaveStephen Esiquio Boesen, MD 01/04/14 1721

## 2014-04-07 ENCOUNTER — Ambulatory Visit
Admission: RE | Admit: 2014-04-07 | Discharge: 2014-04-07 | Disposition: A | Discharge status: Home / Self Care | Payer: Medicaid Other | Source: Ambulatory Visit | Attending: Internal Medicine | Admitting: Internal Medicine

## 2014-04-07 ENCOUNTER — Other Ambulatory Visit: Payer: Self-pay | Admitting: Internal Medicine

## 2014-04-07 DIAGNOSIS — M25571 Pain in right ankle and joints of right foot: Secondary | ICD-10-CM

## 2014-05-04 ENCOUNTER — Other Ambulatory Visit: Payer: Self-pay | Admitting: Orthopedic Surgery

## 2014-05-04 DIAGNOSIS — M79671 Pain in right foot: Secondary | ICD-10-CM

## 2014-05-06 ENCOUNTER — Other Ambulatory Visit: Payer: Self-pay | Admitting: Orthopedic Surgery

## 2014-05-06 ENCOUNTER — Inpatient Hospital Stay: Admission: RE | Admit: 2014-05-06 | Payer: Medicaid Other | Source: Ambulatory Visit

## 2014-05-06 ENCOUNTER — Ambulatory Visit
Admission: RE | Admit: 2014-05-06 | Discharge: 2014-05-06 | Disposition: A | Discharge status: Home / Self Care | Payer: Medicaid Other | Source: Ambulatory Visit | Attending: Orthopedic Surgery | Admitting: Orthopedic Surgery

## 2014-05-06 DIAGNOSIS — M79671 Pain in right foot: Secondary | ICD-10-CM

## 2014-09-22 ENCOUNTER — Inpatient Hospital Stay (HOSPITAL_COMMUNITY)
Admission: EM | Admit: 2014-09-22 | Discharge: 2014-09-25 | DRG: 203 | Disposition: A | Discharge status: Home / Self Care | Payer: Medicaid Other | Attending: Family Medicine | Admitting: Family Medicine

## 2014-09-22 ENCOUNTER — Encounter (HOSPITAL_COMMUNITY): Payer: Self-pay | Admitting: Emergency Medicine

## 2014-09-22 ENCOUNTER — Emergency Department (HOSPITAL_COMMUNITY): Payer: Medicaid Other

## 2014-09-22 DIAGNOSIS — J45901 Unspecified asthma with (acute) exacerbation: Principal | ICD-10-CM | POA: Diagnosis present

## 2014-09-22 DIAGNOSIS — J4520 Mild intermittent asthma, uncomplicated: Secondary | ICD-10-CM

## 2014-09-22 DIAGNOSIS — R079 Chest pain, unspecified: Secondary | ICD-10-CM

## 2014-09-22 DIAGNOSIS — F909 Attention-deficit hyperactivity disorder, unspecified type: Secondary | ICD-10-CM | POA: Diagnosis present

## 2014-09-22 DIAGNOSIS — R0602 Shortness of breath: Secondary | ICD-10-CM

## 2014-09-22 DIAGNOSIS — J209 Acute bronchitis, unspecified: Secondary | ICD-10-CM | POA: Diagnosis present

## 2014-09-22 DIAGNOSIS — Z9119 Patient's noncompliance with other medical treatment and regimen: Secondary | ICD-10-CM | POA: Diagnosis present

## 2014-09-22 DIAGNOSIS — Z6834 Body mass index (BMI) 34.0-34.9, adult: Secondary | ICD-10-CM

## 2014-09-22 DIAGNOSIS — F329 Major depressive disorder, single episode, unspecified: Secondary | ICD-10-CM | POA: Diagnosis present

## 2014-09-22 LAB — COMPREHENSIVE METABOLIC PANEL
ALBUMIN: 3.9 g/dL (ref 3.5–5.2)
ALK PHOS: 99 U/L (ref 39–117)
ALT: 38 U/L — AB (ref 0–35)
AST: 27 U/L (ref 0–37)
Anion gap: 17 — ABNORMAL HIGH (ref 5–15)
BUN: 9 mg/dL (ref 6–23)
CO2: 24 mEq/L (ref 19–32)
Calcium: 9.6 mg/dL (ref 8.4–10.5)
Chloride: 95 mEq/L — ABNORMAL LOW (ref 96–112)
Creatinine, Ser: 0.77 mg/dL (ref 0.50–1.10)
GFR calc Af Amer: >90 mL/min (ref >90)
GFR calc non Af Amer: >90 mL/min (ref >90)
GLUCOSE: 99 mg/dL (ref 70–99)
Potassium: 3.2 mEq/L — ABNORMAL LOW (ref 3.7–5.3)
SODIUM: 136 meq/L — AB (ref 137–147)
TOTAL PROTEIN: 8.4 g/dL — AB (ref 6.0–8.3)
Total Bilirubin: 0.5 mg/dL (ref 0.3–1.2)

## 2014-09-22 LAB — CBC
HCT: 38.2 % (ref 36.0–46.0)
HEMOGLOBIN: 11.4 g/dL — AB (ref 12.0–15.0)
MCH: 19.4 pg — ABNORMAL LOW (ref 26.0–34.0)
MCHC: 29.8 g/dL — AB (ref 30.0–36.0)
MCV: 65 fL — ABNORMAL LOW (ref 78.0–100.0)
Platelets: 323 10*3/uL (ref 150–400)
RBC: 5.88 MIL/uL — ABNORMAL HIGH (ref 3.87–5.11)
RDW: 17.3 % — AB (ref 11.5–15.5)
WBC: 11 10*3/uL — ABNORMAL HIGH (ref 4.0–10.5)

## 2014-09-22 MED ORDER — IPRATROPIUM BROMIDE 0.02 % IN SOLN
0.5000 mg | Freq: Once | RESPIRATORY_TRACT | Status: AC
  Administered 2014-09-22: 0.5 mg via RESPIRATORY_TRACT
  Filled 2014-09-22: qty 2.5

## 2014-09-22 MED ORDER — ALBUTEROL SULFATE (2.5 MG/3ML) 0.083% IN NEBU: 5.0000 mg | INHALATION_SOLUTION | Freq: Once | RESPIRATORY_TRACT | Status: DC

## 2014-09-22 MED ORDER — POTASSIUM CHLORIDE 20 MEQ/15ML (10%) PO SOLN
40.0000 meq | Freq: Once | ORAL | Status: AC
  Administered 2014-09-22: 40 meq via ORAL
  Filled 2014-09-22: qty 30

## 2014-09-22 MED ORDER — METHYLPREDNISOLONE SODIUM SUCC 125 MG IJ SOLR
125.0000 mg | Freq: Once | INTRAMUSCULAR | Status: AC
  Administered 2014-09-22: 125 mg via INTRAVENOUS
  Filled 2014-09-22: qty 2

## 2014-09-22 MED ORDER — ALBUTEROL (5 MG/ML) CONTINUOUS INHALATION SOLN
10.0000 mg/h | INHALATION_SOLUTION | RESPIRATORY_TRACT | Status: DC
  Administered 2014-09-22: 10 mg/h via RESPIRATORY_TRACT
  Filled 2014-09-22: qty 20

## 2014-09-22 MED ORDER — HYDROCOD POLST-CHLORPHEN POLST 10-8 MG/5ML PO LQCR
5.0000 mL | Freq: Once | ORAL | Status: AC
  Administered 2014-09-22: 5 mL via ORAL
  Filled 2014-09-22: qty 5

## 2014-09-22 MED ORDER — LORAZEPAM 1 MG PO TABS
1.0000 mg | ORAL_TABLET | Freq: Once | ORAL | Status: AC
  Administered 2014-09-23: 1 mg via ORAL
  Filled 2014-09-22: qty 1

## 2014-09-22 MED ORDER — SODIUM CHLORIDE 0.9 % IV BOLUS (SEPSIS)
1000.0000 mL | Freq: Once | INTRAVENOUS | Status: AC
  Administered 2014-09-23: 1000 mL via INTRAVENOUS

## 2014-09-22 MED ORDER — SODIUM CHLORIDE 0.9 % IV BOLUS (SEPSIS)
1000.0000 mL | Freq: Once | INTRAVENOUS | Status: AC
  Administered 2014-09-22: 1000 mL via INTRAVENOUS

## 2014-09-22 MED ORDER — IPRATROPIUM-ALBUTEROL 0.5-2.5 (3) MG/3ML IN SOLN
3.0000 mL | Freq: Once | RESPIRATORY_TRACT | Status: AC
  Administered 2014-09-22: 3 mL via RESPIRATORY_TRACT
  Filled 2014-09-22: qty 3

## 2014-09-22 MED ORDER — IPRATROPIUM BROMIDE 0.02 % IN SOLN
0.5000 mg | Freq: Once | RESPIRATORY_TRACT | Status: DC
  Filled 2014-09-22: qty 2.5

## 2014-09-22 NOTE — ED Notes (Signed)
Patient transported to X-ray 

## 2014-09-22 NOTE — ED Notes (Signed)
Pt reports feeling better after breathing tx. Wheezes still noted throughout lungs. o2 sats 97%

## 2014-09-22 NOTE — ED Notes (Signed)
Pt reports wheezing for a couple days. insp and esp wheezing noted. Pt has been out of medications for months. Productive Cough noted as well.

## 2014-09-22 NOTE — ED Notes (Signed)
RN called respiratory for neb treatment

## 2014-09-22 NOTE — ED Provider Notes (Signed)
CSN: [id number]     Arrival date & time 09/22/14  1905 History   First MD Initiated Contact with Patient 09/22/14 1939     Chief Complaint  Patient presents with  . Asthma     (Consider location/radiation/quality/duration/timing/severity/associated sxs/prior Treatment) HPI Kristin Bentley is a 23 y.o. female with history of asthma, presents to ED complainig of cough and sob. Patient states her symptoms began 2 days ago. States she does not have an inhaler and she has not taken any medications. States she is having cough, which is productive with clear sputum. She denies any fever or chills. She states she is wheezing. She states she does have pain of bilateral rib cage from coughing. No recent travel or surgeries. No swelling or pain in legs. No other URI symptoms. Patient states nothing is making her symptoms are better worse. No known triggers.  Past Medical History  Diagnosis Date  . Asthma   . ADHD (attention deficit hyperactivity disorder)    History reviewed. No pertinent past surgical history. No family history on file. History  Substance Use Topics  . Smoking status: Never Smoker   . Smokeless tobacco: Not on file  . Alcohol Use: No   OB History    No data available     Review of Systems  Constitutional: Negative for fever and chills.  HENT: Negative.   Respiratory: Positive for chest tightness, shortness of breath and wheezing. Negative for cough.   Cardiovascular: Positive for chest pain. Negative for palpitations and leg swelling.  Gastrointestinal: Negative for nausea, vomiting, abdominal pain and diarrhea.  Genitourinary: Negative for dysuria and flank pain.  Musculoskeletal: Negative for myalgias, arthralgias, neck pain and neck stiffness.  Skin: Negative for rash.  Neurological: Negative for dizziness, weakness and headaches.  All other systems reviewed and are negative.     Allergies  Bee venom and Citrus  Home Medications   Prior to Admission  medications   Medication Sig Start Date End Date Taking? Authorizing Provider  albuterol (PROVENTIL HFA;VENTOLIN HFA) 108 (90 BASE) MCG/ACT inhaler Inhale 1-2 puffs into the lungs every 6 (six) hours as needed for wheezing or shortness of breath. 11/27/13  Yes Joya Gaskins, MD  diphenhydrAMINE (BENADRYL) 25 MG tablet Take 1 tablet (25 mg total) by mouth every 6 (six) hours. 01/04/14  Yes Robyn M Hess, PA-C  EPINEPHrine (EPIPEN) 0.3 mg/0.3 mL SOAJ injection Inject 0.3 mg into the muscle as needed (for reaction).   Yes Historical Provider, MD  guanFACINE (INTUNIV) 1 MG TB24 Take 1 mg by mouth daily.   Yes Historical Provider, MD  clindamycin (CLEOCIN) 300 MG capsule Take 1 capsule (300 mg total) by mouth 4 (four) times daily. X 7 days Patient not taking: Reported on 09/22/2014 01/04/14   Kathrynn Speed, PA-C  ibuprofen (ADVIL,MOTRIN) 800 MG tablet Take 1 tablet (800 mg total) by mouth every 8 (eight) hours as needed for mild pain or moderate pain. Patient not taking: Reported on 09/22/2014 12/16/13   Trixie Dredge, PA-C  predniSONE (DELTASONE) 20 MG tablet 2 tabs po daily x 4 days Patient not taking: Reported on 09/22/2014 01/04/14   Nada Boozer Hess, PA-C   BP 137/52 mmHg  Pulse 109  Temp(Src) 98.6 F (37 C) (Oral)  Resp 28  SpO2 95%  LMP 09/19/2014 (Approximate) Physical Exam  Constitutional: She is oriented to person, place, and time. She appears well-developed and well-nourished. No distress.  HENT:  Head: Normocephalic.  Eyes: Conjunctivae are normal.  Neck: Neck supple.  Cardiovascular: Normal rate, regular rhythm and normal heart sounds.   Pulmonary/Chest: Effort normal. No respiratory distress. She has wheezes. She has no rales.  Patient is speaking in 3 word sentences, frequent dry spastic cough. Diffuse inspiratory and expiratory wheezes.  Abdominal: Soft. Bowel sounds are normal. She exhibits no distension. There is no tenderness. There is no rebound.  Musculoskeletal: She exhibits no  edema.  Negative Homans sign bilaterally  Neurological: She is alert and oriented to person, place, and time.  Skin: Skin is warm and dry.  Psychiatric: She has a normal mood and affect. Her behavior is normal.  Nursing note and vitals reviewed.   ED Course  Procedures (including critical care time) Labs Review Labs Reviewed  CBC - Abnormal; Notable for the following:    WBC 11.0 (*)    RBC 5.88 (*)    Hemoglobin 11.4 (*)    MCV 65.0 (*)    MCH 19.4 (*)    MCHC 29.8 (*)    RDW 17.3 (*)    All other components within normal limits  COMPREHENSIVE METABOLIC PANEL - Abnormal; Notable for the following:    Sodium 136 (*)    Potassium 3.2 (*)    Chloride 95 (*)    Total Protein 8.4 (*)    ALT 38 (*)    Anion gap 17 (*)    All other components within normal limits  D-DIMER, QUANTITATIVE - Abnormal; Notable for the following:    D-Dimer, Quant 0.53 (*)    All other components within normal limits  CBC  BASIC METABOLIC PANEL  POC URINE PREG, ED    Imaging Review Dg Chest 2 View  09/22/2014   CLINICAL DATA:  Productive cough, shortness of breath for 2 days.  EXAM: CHEST  2 VIEW  COMPARISON:  11/26/2013  FINDINGS: Mild peribronchial thickening. Heart and mediastinal contours are within normal limits. No focal opacities or effusions. No acute bony abnormality.  IMPRESSION: Mild bronchitic changes.   Electronically Signed   By: Charlett NoseKevin  Dover M.D.   On: 09/22/2014 21:35   Ct Angio Chest Pe W/cm &/or Wo Cm  09/23/2014   CLINICAL DATA:  Shortness of breath and chest pain.  EXAM: CT ANGIOGRAPHY CHEST WITH CONTRAST  TECHNIQUE: Multidetector CT imaging of the chest was performed using the standard protocol during bolus administration of intravenous contrast. Multiplanar CT image reconstructions and MIPs were obtained to evaluate the vascular anatomy.  CONTRAST:  150mL OMNIPAQUE IOHEXOL 350 MG/ML SOLN  COMPARISON:  None.  FINDINGS: THORACIC INLET/BODY WALL:  No acute abnormality.  MEDIASTINUM:   No cardiomegaly or pericardial effusion.  No aortic dissection.  Limited CT pulmonary angiography mainly due to patient motion. This scan is essentially free breathing, exacerbated by cardiac motion. Bolus dispersion is also limiting factor, requiring repeat acquisition. There is no evidence of pulmonary embolism, with sensitivity significantly limited beyond the lobar pulmonary arteries.  LUNG WINDOWS:  There is multi focal mucoid impaction and bronchial wall thickening. Small subpleural densities at both apices, favors represent secondary pulmonary lobule atelectasis.  UPPER ABDOMEN:  No acute findings.  OSSEOUS:  No acute fracture.  No suspicious lytic or blastic lesions.  Review of the MIP images confirms the above findings.  IMPRESSION: 1. Significantly limited pulmonary angiography due to motion, with sensitivity limited beyond the lobar level. No evidence of pulmonary embolism. 2. Bronchitis with multi focal mucoid impaction.   Electronically Signed   By: Tiburcio PeaJonathan  Watts M.D.   On: 09/23/2014 02:59  EKG Interpretation   Date/Time:  Thursday September 23 2014 00:23:38 EST Ventricular Rate:  134 PR Interval:  99 QRS Duration: 94 QT Interval:  318 QTC Calculation: 475 R Axis:   69 Text Interpretation:  Sinus tachycardia Borderline repolarization  abnormality Baseline wander in lead(s) II Rate faster Confirmed by Manus GunningANCOUR   MD, STEPHEN ([id number]) on 09/23/2014 12:30:43 AM      MDM   Final diagnoses:  SOB (shortness of breath)  Chest pain  Asthma, mild intermittent, uncomplicated     patient is here with wheezing, cough, shortness of breath. Patient already received a neb treatment prior to me seeing her, she states she feels better however she continues to have wheezing, speaking in only 3 word sentences. maintaining oxygen sat at 95%. Will order hour-long neb. Labs already ordered by triage RN. Will add chest x-ray given shortness of breath, chest pain, and cough. Solu-Medrol 125 mg  ordered. We will continue to monitor.  9:58 PM CXR negative. Labs urnemarkable. Pt just started hour long neb. She had another short tx prior the CXR. Feeling better.   3:30 AM Patient d-dimer came back elevated, CT angios obtained. CT images negative. Patient ambulated in the hallway, oxygen saturation remains at 93%. Patient is however still tachypnea, respiratory rate in mid to high 30s. She feels dizzy upon standing and walking. She is tachycardic at 120s and 130s. Tachycardia most likely from albuterol. Given mild improvement and continued symptoms we'll admit. Results imply discussed with patient.  Filed Vitals:   09/23/14 0200 09/23/14 0300 09/23/14 0323 09/23/14 0400  BP: 126/64 112/57  118/60  Pulse: 133 128  128  Temp:      TempSrc:      Resp: 37 36  39  SpO2: 91% 96% 93% 92%     Lottie Musselatyana A Charish Schroepfer, PA-C 09/23/14 0431  Glynn OctaveStephen Rancour, MD 09/23/14 1102

## 2014-09-22 NOTE — ED Notes (Signed)
PT monitored by pulse ox, bp cuff, and 5-lead. 

## 2014-09-23 ENCOUNTER — Emergency Department (HOSPITAL_COMMUNITY): Payer: Medicaid Other

## 2014-09-23 ENCOUNTER — Encounter (HOSPITAL_COMMUNITY): Payer: Self-pay | Admitting: Radiology

## 2014-09-23 DIAGNOSIS — R0602 Shortness of breath: Secondary | ICD-10-CM | POA: Diagnosis present

## 2014-09-23 DIAGNOSIS — J45901 Unspecified asthma with (acute) exacerbation: Secondary | ICD-10-CM | POA: Diagnosis present

## 2014-09-23 DIAGNOSIS — F329 Major depressive disorder, single episode, unspecified: Secondary | ICD-10-CM | POA: Diagnosis present

## 2014-09-23 DIAGNOSIS — Z9119 Patient's noncompliance with other medical treatment and regimen: Secondary | ICD-10-CM | POA: Diagnosis present

## 2014-09-23 DIAGNOSIS — F909 Attention-deficit hyperactivity disorder, unspecified type: Secondary | ICD-10-CM | POA: Diagnosis present

## 2014-09-23 DIAGNOSIS — J209 Acute bronchitis, unspecified: Secondary | ICD-10-CM | POA: Diagnosis present

## 2014-09-23 DIAGNOSIS — Z6834 Body mass index (BMI) 34.0-34.9, adult: Secondary | ICD-10-CM | POA: Diagnosis not present

## 2014-09-23 LAB — D-DIMER, QUANTITATIVE: D-Dimer, Quant: 0.53 ug/mL-FEU — ABNORMAL HIGH (ref 0.00–0.48)

## 2014-09-23 LAB — BASIC METABOLIC PANEL
Anion gap: 19 — ABNORMAL HIGH (ref 5–15)
BUN: 7 mg/dL (ref 6–23)
CHLORIDE: 102 meq/L (ref 96–112)
CO2: 18 mEq/L — ABNORMAL LOW (ref 19–32)
Calcium: 9.5 mg/dL (ref 8.4–10.5)
Creatinine, Ser: 0.62 mg/dL (ref 0.50–1.10)
GFR calc Af Amer: >90 mL/min (ref >90)
GFR calc non Af Amer: >90 mL/min (ref >90)
Glucose, Bld: 177 mg/dL — ABNORMAL HIGH (ref 70–99)
Potassium: 3.8 mEq/L (ref 3.7–5.3)
Sodium: 139 mEq/L (ref 137–147)

## 2014-09-23 LAB — CBC
HEMATOCRIT: 35.8 % — AB (ref 36.0–46.0)
Hemoglobin: 10.9 g/dL — ABNORMAL LOW (ref 12.0–15.0)
MCH: 20.1 pg — ABNORMAL LOW (ref 26.0–34.0)
MCHC: 30.4 g/dL (ref 30.0–36.0)
MCV: 65.9 fL — AB (ref 78.0–100.0)
Platelets: 319 10*3/uL (ref 150–400)
RBC: 5.43 MIL/uL — ABNORMAL HIGH (ref 3.87–5.11)
RDW: 17.5 % — ABNORMAL HIGH (ref 11.5–15.5)
WBC: 8.8 10*3/uL (ref 4.0–10.5)

## 2014-09-23 LAB — POC URINE PREG, ED: Preg Test, Ur: NEGATIVE

## 2014-09-23 MED ORDER — CETYLPYRIDINIUM CHLORIDE 0.05 % MT LIQD
7.0000 mL | Freq: Two times a day (BID) | OROMUCOSAL | Status: DC
  Administered 2014-09-23 – 2014-09-25 (×5): 7 mL via OROMUCOSAL

## 2014-09-23 MED ORDER — ALBUTEROL SULFATE (2.5 MG/3ML) 0.083% IN NEBU: 5.0000 mg | INHALATION_SOLUTION | RESPIRATORY_TRACT | Status: DC | PRN

## 2014-09-23 MED ORDER — GUANFACINE HCL ER 1 MG PO TB24
1.0000 mg | ORAL_TABLET | Freq: Every day | ORAL | Status: DC
  Administered 2014-09-23 – 2014-09-25 (×3): 1 mg via ORAL
  Filled 2014-09-23 (×5): qty 1

## 2014-09-23 MED ORDER — DIPHENHYDRAMINE HCL 25 MG PO CAPS
25.0000 mg | ORAL_CAPSULE | Freq: Four times a day (QID) | ORAL | Status: DC
  Administered 2014-09-23 – 2014-09-25 (×10): 25 mg via ORAL
  Filled 2014-09-23 (×15): qty 1

## 2014-09-23 MED ORDER — ONDANSETRON HCL 4 MG/2ML IJ SOLN: 4.0000 mg | Freq: Three times a day (TID) | INTRAMUSCULAR | Status: DC | PRN

## 2014-09-23 MED ORDER — METHYLPREDNISOLONE SODIUM SUCC 125 MG IJ SOLR
60.0000 mg | Freq: Four times a day (QID) | INTRAMUSCULAR | Status: DC
  Administered 2014-09-23 – 2014-09-24 (×4): 60 mg via INTRAVENOUS
  Filled 2014-09-23: qty 2
  Filled 2014-09-23: qty 0.96
  Filled 2014-09-23 (×2): qty 2
  Filled 2014-09-23 (×3): qty 0.96
  Filled 2014-09-23: qty 2

## 2014-09-23 MED ORDER — MOMETASONE FURO-FORMOTEROL FUM 200-5 MCG/ACT IN AERO
2.0000 | INHALATION_SPRAY | Freq: Two times a day (BID) | RESPIRATORY_TRACT | Status: DC
  Administered 2014-09-24 – 2014-09-25 (×3): 2 via RESPIRATORY_TRACT
  Filled 2014-09-23: qty 8.8

## 2014-09-23 MED ORDER — METHYLPREDNISOLONE SODIUM SUCC 125 MG IJ SOLR
60.0000 mg | Freq: Two times a day (BID) | INTRAMUSCULAR | Status: DC
  Administered 2014-09-23: 60 mg via INTRAVENOUS
  Filled 2014-09-23: qty 2

## 2014-09-23 MED ORDER — HEPARIN SODIUM (PORCINE) 5000 UNIT/ML IJ SOLN
5000.0000 [IU] | Freq: Three times a day (TID) | INTRAMUSCULAR | Status: DC
  Administered 2014-09-23 – 2014-09-25 (×7): 5000 [IU] via SUBCUTANEOUS
  Filled 2014-09-23 (×13): qty 1

## 2014-09-23 MED ORDER — ALBUTEROL SULFATE (2.5 MG/3ML) 0.083% IN NEBU: 2.5000 mg | INHALATION_SOLUTION | Freq: Four times a day (QID) | RESPIRATORY_TRACT | Status: DC | PRN

## 2014-09-23 MED ORDER — IOHEXOL 350 MG/ML SOLN
80.0000 mL | Freq: Once | INTRAVENOUS | Status: AC | PRN
  Administered 2014-09-23: 150 mL via INTRAVENOUS

## 2014-09-23 MED ORDER — POLYETHYLENE GLYCOL 3350 17 G PO PACK
17.0000 g | PACK | Freq: Two times a day (BID) | ORAL | Status: DC | PRN
  Administered 2014-09-23: 17 g via ORAL
  Filled 2014-09-23 (×3): qty 1

## 2014-09-23 MED ORDER — ALBUTEROL SULFATE (2.5 MG/3ML) 0.083% IN NEBU
2.5000 mg | INHALATION_SOLUTION | RESPIRATORY_TRACT | Status: DC
  Administered 2014-09-23 – 2014-09-24 (×8): 2.5 mg via RESPIRATORY_TRACT
  Filled 2014-09-23 (×9): qty 3

## 2014-09-23 MED ORDER — SODIUM CHLORIDE 0.9 % IJ SOLN
3.0000 mL | Freq: Two times a day (BID) | INTRAMUSCULAR | Status: DC
  Administered 2014-09-23 – 2014-09-25 (×3): 3 mL via INTRAVENOUS

## 2014-09-23 MED ORDER — PNEUMOCOCCAL VAC POLYVALENT 25 MCG/0.5ML IJ INJ
0.5000 mL | INJECTION | INTRAMUSCULAR | Status: AC
  Administered 2014-09-24: 0.5 mL via INTRAMUSCULAR
  Filled 2014-09-23: qty 0.5

## 2014-09-23 MED ORDER — MONTELUKAST SODIUM 10 MG PO TABS
10.0000 mg | ORAL_TABLET | Freq: Every day | ORAL | Status: DC
  Administered 2014-09-23 – 2014-09-24 (×2): 10 mg via ORAL
  Filled 2014-09-23 (×3): qty 1

## 2014-09-23 MED ORDER — SODIUM CHLORIDE 0.9 % IV SOLN
INTRAVENOUS | Status: DC
  Administered 2014-09-23: 06:00:00 via INTRAVENOUS

## 2014-09-23 MED ORDER — ALBUTEROL SULFATE HFA 108 (90 BASE) MCG/ACT IN AERS: 1.0000 | INHALATION_SPRAY | Freq: Four times a day (QID) | RESPIRATORY_TRACT | Status: DC | PRN

## 2014-09-23 MED ORDER — MAGNESIUM SULFATE 2 GM/50ML IV SOLN
2.0000 g | Freq: Once | INTRAVENOUS | Status: AC
  Administered 2014-09-23: 2 g via INTRAVENOUS
  Filled 2014-09-23: qty 50

## 2014-09-23 NOTE — ED Notes (Signed)
Transporting patient to new room assignment. 

## 2014-09-23 NOTE — Plan of Care (Signed)
Problem: Consults Goal: Respiratory Problems Patient Education See Patient Education Module for education specifics.  Outcome: Completed/Met Date Met:  09/23/14 Goal: Skin Care Protocol Initiated - if Braden Score 18 or less If consults are not indicated, leave blank or document N/A  Outcome: Not Applicable Date Met:  87/18/36 Goal: Nutrition Consult-if indicated Outcome: Not Applicable Date Met:  72/55/00  Problem: Phase I Progression Outcomes Goal: Dyspnea controlled at rest Outcome: Progressing Goal: Hemodynamically stable Outcome: Completed/Met Date Met:  09/23/14 Goal: Flu/PneumoVaccines if indicated Outcome: Progressing Goal: Pain controlled Outcome: Completed/Met Date Met:  09/23/14 Goal: Progress activity as tolerated unless otherwise ordered Outcome: Progressing Goal: Tolerating diet Outcome: Completed/Met Date Met:  09/23/14  Problem: Phase II Progression Outcomes Goal: Pain controlled on oral analgesia Outcome: Completed/Met Date Met:  09/23/14 Goal: Tolerating diet Outcome: Completed/Met Date Met:  09/23/14  Problem: Discharge Progression Outcomes Goal: Pneumonia vaccine received if indicated Outcome: Progressing Goal: Tolerating diet Outcome: Completed/Met Date Met:  09/23/14 Goal: Hemodynamically stable Outcome: Completed/Met Date Met:  09/23/14

## 2014-09-23 NOTE — Progress Notes (Signed)
11:17 AM I agree with HPI/GPe and A/P per Dr. Julian ReilGardner  [age] y/o ?, known severe persistent asthma, non-compliant on meds as couldn't;t get them refilled from her MD and hasn't taken Asthma meds since Aug this year admitted overnight 11/26 am with severe SOB  Looks tired Wheezing  Coughing Mild CP No sputum  HEENT eomi ncat CHEST wheeze but diminished and decreased AE CARDIAC s1 s 2 no m/r/g   Patient Active Problem List   Diagnosis Date Noted  . Asthma exacerbation 09/23/2014  . Acute bronchitis 09/23/2014   Continue Nebs with Albuterol-changed to more frequently q4 scheduled for today Increased Solu-medrol to 60 q 6  Added Dulera Monitor for resolution-probably needs 1-2 IV steroids  Pleas KochJai Shonn Farruggia, MD [facility] [phone]

## 2014-09-23 NOTE — H&P (Signed)
[facility] History and Physical  Kristin SharpsKhadijah Ferrall ZOX:[id number] DOB: 06-Jul-1991 DOA: 09/22/2014  Referring physician: EDP PCP: Lorenda PeckOBERTS, RONALD WAYNE, MD   Chief Complaint: Asthma   HPI: Kristin Bentley is a 23 y.o. female with history of asthma who presents to the ED in respiratory distress due to asthma exacerbation.  Symptoms include cough and SOB.  Onset 2 days ago, does not have an inhaler at home and hasnt taken any meds.  Nothing makes symptoms better, activity makes them worse.  Has bilateral rib cage pain from coughing.  Review of Systems: Systems reviewed.  As above, otherwise negative  Past Medical History  Diagnosis Date  . Asthma   . ADHD (attention deficit hyperactivity disorder)    History reviewed. No pertinent past surgical history. Social History:  reports that she has never smoked. She does not have any smokeless tobacco history on file. She reports that she does not drink alcohol or use illicit drugs.  Allergies  Allergen Reactions  . Bee Venom Anaphylaxis  . Citrus Swelling    No family history on file.   Prior to Admission medications   Medication Sig Start Date End Date Taking? Authorizing Provider  albuterol (PROVENTIL HFA;VENTOLIN HFA) 108 (90 BASE) MCG/ACT inhaler Inhale 1-2 puffs into the lungs every 6 (six) hours as needed for wheezing or shortness of breath. 11/27/13  Yes Joya Gaskinsonald W Wickline, MD  diphenhydrAMINE (BENADRYL) 25 MG tablet Take 1 tablet (25 mg total) by mouth every 6 (six) hours. 01/04/14  Yes Robyn M Hess, PA-C  EPINEPHrine (EPIPEN) 0.3 mg/0.3 mL SOAJ injection Inject 0.3 mg into the muscle as needed (for reaction).   Yes Historical Provider, MD  guanFACINE (INTUNIV) 1 MG TB24 Take 1 mg by mouth daily.   Yes Historical Provider, MD   Physical Exam: Filed Vitals:   09/23/14 0300  BP: 112/57  Pulse: 128  Temp:   Resp: 36    BP 112/57 mmHg  Pulse 128  Temp(Src) 98.9 F (37.2 C) (Oral)  Resp 36  SpO2 93%  LMP  09/19/2014 (Approximate)  General Appearance:    Alert, oriented, no distress, appears stated age  Head:    Normocephalic, atraumatic  Eyes:    PERRL, EOMI, sclera non-icteric        Nose:   Nares without drainage or epistaxis. Mucosa, turbinates normal  Throat:   Moist mucous membranes. Oropharynx without erythema or exudate.  Neck:   Supple. No carotid bruits.  No thyromegaly.  No lymphadenopathy.   Back:     No CVA tenderness, no spinal tenderness  Lungs:     Clear to auscultation bilaterally, without wheezes, rhonchi or rales  Chest wall:    No tenderness to palpitation  Heart:    Regular rate and rhythm without murmurs, gallops, rubs  Abdomen:     Soft, non-tender, nondistended, normal bowel sounds, no organomegaly  Genitalia:    deferred  Rectal:    deferred  Extremities:   No clubbing, cyanosis or edema.  Pulses:   2+ and symmetric all extremities  Skin:   Skin color, texture, turgor normal, no rashes or lesions  Lymph nodes:   Cervical, supraclavicular, and axillary nodes normal  Neurologic:   CNII-XII intact. Normal strength, sensation and reflexes      throughout    Labs on Admission:  Basic Metabolic Panel:  Recent Labs Lab 09/22/14 2000  NA 136*  K 3.2*  CL 95*  CO2 24  GLUCOSE 99  BUN 9  CREATININE 0.77  CALCIUM 9.6   Liver Function Tests:  Recent Labs Lab 09/22/14 2000  AST 27  ALT 38*  ALKPHOS 99  BILITOT 0.5  PROT 8.4*  ALBUMIN 3.9   No results for input(s): LIPASE, AMYLASE in the last 168 hours. No results for input(s): AMMONIA in the last 168 hours. CBC:  Recent Labs Lab 09/22/14 2000  WBC 11.0*  HGB 11.4*  HCT 38.2  MCV 65.0*  PLT 323   Cardiac Enzymes: No results for input(s): CKTOTAL, CKMB, CKMBINDEX, TROPONINI in the last 168 hours.  BNP (last 3 results) No results for input(s): PROBNP in the last 8760 hours. CBG: No results for input(s): GLUCAP in the last 168 hours.  Radiological Exams on Admission: Dg Chest 2  View  09/22/2014   CLINICAL DATA:  Productive cough, shortness of breath for 2 days.  EXAM: CHEST  2 VIEW  COMPARISON:  11/26/2013  FINDINGS: Mild peribronchial thickening. Heart and mediastinal contours are within normal limits. No focal opacities or effusions. No acute bony abnormality.  IMPRESSION: Mild bronchitic changes.   Electronically Signed   By: Charlett NoseKevin  Dover M.D.   On: 09/22/2014 21:35   Ct Angio Chest Pe W/cm &/or Wo Cm  09/23/2014   CLINICAL DATA:  Shortness of breath and chest pain.  EXAM: CT ANGIOGRAPHY CHEST WITH CONTRAST  TECHNIQUE: Multidetector CT imaging of the chest was performed using the standard protocol during bolus administration of intravenous contrast. Multiplanar CT image reconstructions and MIPs were obtained to evaluate the vascular anatomy.  CONTRAST:  150mL OMNIPAQUE IOHEXOL 350 MG/ML SOLN  COMPARISON:  None.  FINDINGS: THORACIC INLET/BODY WALL:  No acute abnormality.  MEDIASTINUM:  No cardiomegaly or pericardial effusion.  No aortic dissection.  Limited CT pulmonary angiography mainly due to patient motion. This scan is essentially free breathing, exacerbated by cardiac motion. Bolus dispersion is also limiting factor, requiring repeat acquisition. There is no evidence of pulmonary embolism, with sensitivity significantly limited beyond the lobar pulmonary arteries.  LUNG WINDOWS:  There is multi focal mucoid impaction and bronchial wall thickening. Small subpleural densities at both apices, favors represent secondary pulmonary lobule atelectasis.  UPPER ABDOMEN:  No acute findings.  OSSEOUS:  No acute fracture.  No suspicious lytic or blastic lesions.  Review of the MIP images confirms the above findings.  IMPRESSION: 1. Significantly limited pulmonary angiography due to motion, with sensitivity limited beyond the lobar level. No evidence of pulmonary embolism. 2. Bronchitis with multi focal mucoid impaction.   Electronically Signed   By: Tiburcio PeaJonathan  Watts M.D.   On: 09/23/2014  02:59    EKG: Independently reviewed.  Assessment/Plan Principal Problem:   Asthma exacerbation Active Problems:   Acute bronchitis   1. Asthma and acute bronchitis - 1. Adult wheeze protocol for neb treatments 2. Continuous pulse ox 3. Tele monitor 4. Ordering magnesium sulfate 5. Solumedrol 60mg  IV q12h, got 125mg  x1 in ED    Code Status: Full  Family Communication: No family in room Disposition Plan: Admit to inpatient   Time spent: 50 min  Mirjana Tarleton M. [facility] Pager [phone]  If 7AM-7PM, please contact the day team taking care of the patient [email or web address] Password [email or web address] 09/23/2014, 3:46 AM

## 2014-09-24 MED ORDER — ALBUTEROL SULFATE (2.5 MG/3ML) 0.083% IN NEBU
2.5000 mg | INHALATION_SOLUTION | Freq: Three times a day (TID) | RESPIRATORY_TRACT | Status: DC
  Administered 2014-09-25 (×2): 2.5 mg via RESPIRATORY_TRACT
  Filled 2014-09-24 (×3): qty 3

## 2014-09-24 MED ORDER — PREDNISONE 10 MG PO TABS
60.0000 mg | ORAL_TABLET | Freq: Every day | ORAL | Status: DC
  Administered 2014-09-24 – 2014-09-25 (×2): 60 mg via ORAL
  Filled 2014-09-24 (×3): qty 1

## 2014-09-24 NOTE — Progress Notes (Signed)
Nutrition Brief Note  Patient identified on the Malnutrition Screening Tool (MST) Report  Wt Readings from Last 15 Encounters:  09/23/14 227 lb 6.4 oz (103.148 kg)  12/16/13 235 lb (106.595 kg)  06/25/13 230 lb (104.327 kg)   23 y/o ?, known severe persistent asthma, non-compliant on meds as couldn't;t get them refilled from her MD and hasn't taken Asthma meds since Aug this year admitted overnight 11/26 am with severe SOB.   Appetite is good, noted 100% meal completion. Per MD notes, pt will be referred to outpatient nutrition management  center for obesity.   Body mass index is 34.19 kg/(m^2). Patient meets criteria for obesity, class I based on current BMI.   Current diet order is regular, patient is consuming approximately 100% of meals at this time. Labs and medications reviewed.   No nutrition interventions warranted at this time. If nutrition issues arise, please consult RD.   Dalten Ambrosino A. Mayford KnifeWilliams, RD, LDN Pager: [phone] After hours Pager: [phone]

## 2014-09-24 NOTE — Progress Notes (Signed)
Kristin Bentley JXB:[id number] DOB: 1990-11-14 DOA: 09/22/2014 PCP: Lorenda PeckOBERTS, RONALD WAYNE, MD  Brief narrative: 23 y/o ?, known severe persistent asthma, non-compliant on meds as couldn't;t get them refilled from her MD and hasn't taken Asthma meds since Aug this year admitted overnight 11/26 am with severe SOB  Past medical history-As per Problem list Chart reviewed as below- reviewed  Consultants:  none  Procedures:  none  Antibiotics:  None    Subjective  Doing fair, no furthe rSOB really but productive sputum Wheeze less but still there N O SOb Appetite is better   Objective    Interim History: none  Telemetry: Sinus tach   Objective: Filed Vitals:   09/23/14 2353 09/24/14 0308 09/24/14 0549 09/24/14 0819  BP:   117/62   Pulse:   82   Temp:   98.1 F (36.7 C)   TempSrc:   Oral   Resp:   24   Height:      Weight:      SpO2: 94% 96% 96% 97%    Intake/Output Summary (Last 24 hours) at 09/24/14 0924 Last data filed at 09/24/14 0646  Gross per 24 hour  Intake    714 ml  Output   1700 ml  Net   -986 ml    Exam:  General: eomi, ncat  Cardiovascular:  s1 s2 tachy resolved Respiratory:  Wheeze posterolat, no fremitus or resonance Abdomen: soft nt nd Skin no le edema Neuro intact  Data Reviewed: Basic Metabolic Panel:  Recent Labs Lab 09/22/14 2000 09/23/14 0506  NA 136* 139  K 3.2* 3.8  CL 95* 102  CO2 24 18*  GLUCOSE 99 177*  BUN 9 7  CREATININE 0.77 0.62  CALCIUM 9.6 9.5   Liver Function Tests:  Recent Labs Lab 09/22/14 2000  AST 27  ALT 38*  ALKPHOS 99  BILITOT 0.5  PROT 8.4*  ALBUMIN 3.9   No results for input(s): LIPASE, AMYLASE in the last 168 hours. No results for input(s): AMMONIA in the last 168 hours. CBC:  Recent Labs Lab 09/22/14 2000 09/23/14 0506  WBC 11.0* 8.8  HGB 11.4* 10.9*  HCT 38.2 35.8*  MCV 65.0* 65.9*  PLT 323 319   Cardiac Enzymes: No results for input(s): CKTOTAL, CKMB, CKMBINDEX,  TROPONINI in the last 168 hours. BNP: Invalid input(s): POCBNP CBG: No results for input(s): GLUCAP in the last 168 hours.  No results found for this or any previous visit (from the past 240 hour(s)).   Studies:              All Imaging reviewed and is as per above notation   Scheduled Meds: . albuterol  2.5 mg Nebulization Q4H  . antiseptic oral rinse  7 mL Mouth Rinse BID  . diphenhydrAMINE  25 mg Oral Q6H  . guanFACINE  1 mg Oral Daily  . heparin  5,000 Units Subcutaneous 3 times per day  . methylPREDNISolone (SOLU-MEDROL) injection  60 mg Intravenous Q6H  . mometasone-formoterol  2 puff Inhalation BID  . montelukast  10 mg Oral QHS  . pneumococcal 23 valent vaccine  0.5 mL Intramuscular Tomorrow-1000  . sodium chloride  3 mL Intravenous Q12H   Continuous Infusions: . albuterol Stopped (09/23/14 0017)     Assessment/Plan: 1. Severe asthma exacerbation-not status- Solumderol to prednisone 60 mg daily.  Continue q 4 hr neds, continue Dulera 2 puff bid, and montelukast 10 daily 2. Depression-continue Guanfacine 1 mg daily Morbid obesity, Body mass index is 34.19  kg/(m^2).-OP dietic counselling   Code Status: Full Family Communication: none bedside Disposition Plan: inpatient   Pleas KochJai Mcihael Hinderman, MD  [facility] Pager [phone] 09/24/2014, 9:24 AM    LOS: 2 days

## 2014-09-24 NOTE — Progress Notes (Signed)
Spoke with MD about patient's tele. Vent. Trigeminal PVCs noted. Order to discontinue tele. Will continue to monitor.

## 2014-09-24 NOTE — Plan of Care (Signed)
Problem: Phase I Progression Outcomes Goal: Dyspnea controlled at rest Outcome: Progressing Goal: Progress activity as tolerated unless otherwise ordered Outcome: Progressing Goal: Discharge plan established Outcome: Progressing  Problem: Discharge Progression Outcomes Goal: Dyspnea controlled Outcome: Progressing Goal: Pain controlled with appropriate interventions Outcome: Completed/Met Date Met:  09/24/14

## 2014-09-25 MED ORDER — MOMETASONE FURO-FORMOTEROL FUM 200-5 MCG/ACT IN AERO: 2.0000 | INHALATION_SPRAY | Freq: Two times a day (BID) | RESPIRATORY_TRACT | Status: DC

## 2014-09-25 MED ORDER — ALBUTEROL SULFATE HFA 108 (90 BASE) MCG/ACT IN AERS: 1.0000 | INHALATION_SPRAY | Freq: Four times a day (QID) | RESPIRATORY_TRACT | Status: DC | PRN

## 2014-09-25 MED ORDER — PREDNISONE 20 MG PO TABS: 60.0000 mg | ORAL_TABLET | Freq: Every day | ORAL | Status: DC

## 2014-09-25 MED ORDER — MONTELUKAST SODIUM 10 MG PO TABS: 10.0000 mg | ORAL_TABLET | Freq: Every day | ORAL | Status: DC

## 2014-09-25 NOTE — Progress Notes (Signed)
NURSING PROGRESS NOTE  Kristin Bentley [id number] Discharge Data: 09/25/2014 3:33 PM Attending Provider: Rhetta MuraJai-Gurmukh Samtani, MD HQI:[id number]:ROBERTS, Vernie AmmonsONALD WAYNE, MD     Kristin Bentley to be D/C'd Home per MD order.  Discussed with the patient the After Visit Summary and all questions fully answered. All IV's discontinued with no bleeding noted. All belongings returned to patient for patient to take home.   Last Vital Signs:  Blood pressure 134/83, pulse 82, temperature 98.4 F (36.9 C), temperature source Oral, resp. rate 15, height 5' 8.4" (1.737 m), weight 103.148 kg (227 lb 6.4 oz), last menstrual period 09/19/2014, SpO2 95 %.  Discharge Medication List   Medication List    TAKE these medications        albuterol 108 (90 BASE) MCG/ACT inhaler  Commonly known as:  PROVENTIL HFA;VENTOLIN HFA  Inhale 1-2 puffs into the lungs every 6 (six) hours as needed for wheezing or shortness of breath.     diphenhydrAMINE 25 MG tablet  Commonly known as:  BENADRYL  Take 1 tablet (25 mg total) by mouth every 6 (six) hours.     EPIPEN 0.3 mg/0.3 mL Soaj injection  Generic drug:  EPINEPHrine  Inject 0.3 mg into the muscle as needed (for reaction).     guanFACINE 1 MG Tb24  Commonly known as:  INTUNIV  Take 1 mg by mouth daily.     mometasone-formoterol 200-5 MCG/ACT Aero  Commonly known as:  DULERA  Inhale 2 puffs into the lungs 2 (two) times daily.     montelukast 10 MG tablet  Commonly known as:  SINGULAIR  Take 1 tablet (10 mg total) by mouth at bedtime.     predniSONE 20 MG tablet  Commonly known as:  DELTASONE  Take 3 tablets (60 mg total) by mouth daily before breakfast.

## 2014-09-25 NOTE — Discharge Summary (Signed)
Physician Discharge Summary  Kristin Bentley WHQ:[id number] DOB: June 15, 1991 DOA: 09/22/2014  PCP: Myriam Jacobson, MD  Admit date: 09/22/2014 Discharge date: 09/25/2014  Time spent: 20 minutes  Recommendations for Outpatient Follow-up:  1. Patient to be on step wise implemented treatment for asthma she has at least moderate persistent asthma  2. Complete steroids 11/30  Discharge Diagnoses:  Principal Problem:   Asthma exacerbation Active Problems:   Acute bronchitis   Discharge Condition: Good  Diet recommendation: Regular  Filed Weights   09/23/14 0502  Weight: 103.148 kg (227 lb 6.4 oz)    History of present illness:  23 year old obese female with known moderate persistent asthma and noncompliant medications says she cannot get them refilled-hasn't had medication since August admitted 11/26 with severe shortness of breath. She was initially nebulized very frequently and placed on IV Solu-Medrol. This was transitioned to by mouth prednisone 60 mg after 2 days. She got every 4 hourly and we added delirium as well as montelukast 10 mg daily. She should continue her chronic medications. I have prescribed for her refills on her inhalers and she is instructed to follow-up with her regular physician in the outpatient setting. She met maximum benefit from hospital stay and it was felt that she could do well at home    Discharge Exam: Filed Vitals:   09/25/14 0522  BP: 155/75  Pulse: 82  Temp: 97.8 F (36.6 C)  Resp: 16    General: EOMI, NCAT  Cardiovascular: S1-S2 no murmur rub or gallop  Respiratory: Clinically clear  Discharge Instructions You were cared for by a hospitalist during your hospital stay. If you have any questions about your discharge medications or the care you received while you were in the hospital after you are discharged, you can call the unit and asked to speak with the hospitalist on call if the hospitalist that took care of you is not  available. Once you are discharged, your primary care physician will handle any further medical issues. Please note that NO REFILLS for any discharge medications will be authorized once you are discharged, as it is imperative that you return to your primary care physician (or establish a relationship with a primary care physician if you do not have one) for your aftercare needs so that they can reassess your need for medications and monitor your lab values.  Discharge Instructions    Diet - low sodium heart healthy    Complete by:  As directed      Discharge instructions    Complete by:  As directed   Complete course of steroids for 2 more days I have prescribed multiple inhalers that you will need to take-I have prescribed refills for all of these as well so that he will not run out in the future You should be on montelukast as well Please follow-up with your primary care physician closely     Increase activity slowly    Complete by:  As directed           Current Discharge Medication List    START taking these medications   Details  mometasone-formoterol (DULERA) 200-5 MCG/ACT AERO Inhale 2 puffs into the lungs 2 (two) times daily. Qty: 1 Inhaler, Refills: 1    montelukast (SINGULAIR) 10 MG tablet Take 1 tablet (10 mg total) by mouth at bedtime. Qty: 30 tablet, Refills: 1    predniSONE (DELTASONE) 20 MG tablet Take 3 tablets (60 mg total) by mouth daily before breakfast. Qty: 6 tablet, Refills: 0  CONTINUE these medications which have CHANGED   Details  albuterol (PROVENTIL HFA;VENTOLIN HFA) 108 (90 BASE) MCG/ACT inhaler Inhale 1-2 puffs into the lungs every 6 (six) hours as needed for wheezing or shortness of breath. Qty: 1 Inhaler, Refills: 1      CONTINUE these medications which have NOT CHANGED   Details  diphenhydrAMINE (BENADRYL) 25 MG tablet Take 1 tablet (25 mg total) by mouth every 6 (six) hours. Qty: 10 tablet, Refills: 0    EPINEPHrine (EPIPEN) 0.3 mg/0.3 mL  SOAJ injection Inject 0.3 mg into the muscle as needed (for reaction).    guanFACINE (INTUNIV) 1 MG TB24 Take 1 mg by mouth daily.       Allergies  Allergen Reactions  . Bee Venom Anaphylaxis  . Citrus Swelling      The results of significant diagnostics from this hospitalization (including imaging, microbiology, ancillary and laboratory) are listed below for reference.    Significant Diagnostic Studies: Dg Chest 2 View  09/22/2014   CLINICAL DATA:  Productive cough, shortness of breath for 2 days.  EXAM: CHEST  2 VIEW  COMPARISON:  11/26/2013  FINDINGS: Mild peribronchial thickening. Heart and mediastinal contours are within normal limits. No focal opacities or effusions. No acute bony abnormality.  IMPRESSION: Mild bronchitic changes.   Electronically Signed   By: Rolm Baptise M.D.   On: 09/22/2014 21:35   Ct Angio Chest Pe W/cm &/or Wo Cm  09/23/2014   CLINICAL DATA:  Shortness of breath and chest pain.  EXAM: CT ANGIOGRAPHY CHEST WITH CONTRAST  TECHNIQUE: Multidetector CT imaging of the chest was performed using the standard protocol during bolus administration of intravenous contrast. Multiplanar CT image reconstructions and MIPs were obtained to evaluate the vascular anatomy.  CONTRAST:  137m OMNIPAQUE IOHEXOL 350 MG/ML SOLN  COMPARISON:  None.  FINDINGS: THORACIC INLET/BODY WALL:  No acute abnormality.  MEDIASTINUM:  No cardiomegaly or pericardial effusion.  No aortic dissection.  Limited CT pulmonary angiography mainly due to patient motion. This scan is essentially free breathing, exacerbated by cardiac motion. Bolus dispersion is also limiting factor, requiring repeat acquisition. There is no evidence of pulmonary embolism, with sensitivity significantly limited beyond the lobar pulmonary arteries.  LUNG WINDOWS:  There is multi focal mucoid impaction and bronchial wall thickening. Small subpleural densities at both apices, favors represent secondary pulmonary lobule atelectasis.   UPPER ABDOMEN:  No acute findings.  OSSEOUS:  No acute fracture.  No suspicious lytic or blastic lesions.  Review of the MIP images confirms the above findings.  IMPRESSION: 1. Significantly limited pulmonary angiography due to motion, with sensitivity limited beyond the lobar level. No evidence of pulmonary embolism. 2. Bronchitis with multi focal mucoid impaction.   Electronically Signed   By: JJorje GuildM.D.   On: 09/23/2014 02:59    Microbiology: No results found for this or any previous visit (from the past 240 hour(s)).   Labs: Basic Metabolic Panel:  Recent Labs Lab 09/22/14 2000 09/23/14 0506  NA 136* 139  K 3.2* 3.8  CL 95* 102  CO2 24 18*  GLUCOSE 99 177*  BUN 9 7  CREATININE 0.77 0.62  CALCIUM 9.6 9.5   Liver Function Tests:  Recent Labs Lab 09/22/14 2000  AST 27  ALT 38*  ALKPHOS 99  BILITOT 0.5  PROT 8.4*  ALBUMIN 3.9   No results for input(s): LIPASE, AMYLASE in the last 168 hours. No results for input(s): AMMONIA in the last 168 hours. CBC:  Recent Labs  Lab 09/22/14 2000 09/23/14 0506  WBC 11.0* 8.8  HGB 11.4* 10.9*  HCT 38.2 35.8*  MCV 65.0* 65.9*  PLT 323 319   Cardiac Enzymes: No results for input(s): CKTOTAL, CKMB, CKMBINDEX, TROPONINI in the last 168 hours. BNP: BNP (last 3 results) No results for input(s): PROBNP in the last 8760 hours. CBG: No results for input(s): GLUCAP in the last 168 hours.     SignedNita Sells  [facility] 09/25/2014, 11:15 AM

## 2014-12-09 ENCOUNTER — Encounter: Payer: Self-pay | Admitting: Family Medicine

## 2014-12-09 ENCOUNTER — Ambulatory Visit (INDEPENDENT_AMBULATORY_CARE_PROVIDER_SITE_OTHER): Payer: Medicaid Other | Admitting: Family Medicine

## 2014-12-09 VITALS — BP 131/71 | HR 89 | Temp 98.1°F | Resp 16 | Ht 67.0 in | Wt 247.0 lb

## 2014-12-09 DIAGNOSIS — Z23 Encounter for immunization: Secondary | ICD-10-CM

## 2014-12-09 DIAGNOSIS — J4520 Mild intermittent asthma, uncomplicated: Secondary | ICD-10-CM

## 2014-12-09 DIAGNOSIS — E669 Obesity, unspecified: Secondary | ICD-10-CM

## 2014-12-09 DIAGNOSIS — E8881 Metabolic syndrome: Secondary | ICD-10-CM

## 2014-12-09 DIAGNOSIS — Z8659 Personal history of other mental and behavioral disorders: Secondary | ICD-10-CM

## 2014-12-09 MED ORDER — MONTELUKAST SODIUM 10 MG PO TABS: 10.0000 mg | ORAL_TABLET | Freq: Every day | ORAL | Status: DC

## 2014-12-09 MED ORDER — MOMETASONE FURO-FORMOTEROL FUM 200-5 MCG/ACT IN AERO: 2.0000 | INHALATION_SPRAY | Freq: Two times a day (BID) | RESPIRATORY_TRACT | Status: DC

## 2014-12-09 MED ORDER — ALBUTEROL SULFATE HFA 108 (90 BASE) MCG/ACT IN AERS: 2.0000 | INHALATION_SPRAY | Freq: Four times a day (QID) | RESPIRATORY_TRACT | Status: DC | PRN

## 2014-12-09 NOTE — Patient Instructions (Signed)
Obesity Obesity is having too much body fat and a body mass index (BMI) of 30 or more. BMI is a number based on your height and weight. The number is an estimate of how much body fat you have. Obesity can happen if you eat more calories than you can burn by exercising or other activity. It can cause major health problems or emergencies.  HOME CARE  Exercise and be active as told by your doctor. Try:  Using stairs when you can.  Parking farther away from store doors.  Gardening, biking, or walking.  Eat healthy foods and drinks that are low in calories. Eat more fruits and vegetables.  Limit fast food, sweets, and snack foods that are made with ingredients that are not natural (processed food).  Eat smaller amounts of food.  Keep a journal and write down what you eat every day. Websites can help with this.  Avoid drinking alcohol. Drink more water and drinks without calories.   Take vitamins and dietary pills (supplements) only as told by your doctor.  Try going to weight-loss support groups or classes to help lessen stress. Dietitians and counselors may also help. GET HELP RIGHT AWAY IF:  You have chest pain or tightness.  You have trouble breathing or feel short of breath.  You feel weak or have loss of feeling (numbness) in your legs.  You feel confused or have trouble talking.  You have sudden changes in your vision. MAKE SURE YOU:  Understand these instructions.  Will watch your condition.  Will get help right away if you are not doing well or get worse. Document Released: 01/07/2012 Document Revised: 03/01/2014 Document Reviewed: 01/07/2012 [facility] Patient Information 2015 [place], [place]. This information is not intended to replace advice given to you by your health care provider. Make sure you discuss any questions you have with your health care provider. Asthma Attack Prevention Although there is no way to prevent asthma from starting, you can take steps to  control the disease and reduce its symptoms. Learn about your asthma and how to control it. Take an active role to control your asthma by working with your health care provider to create and follow an asthma action plan. An asthma action plan guides you in:  Taking your medicines properly.  Avoiding things that set off your asthma or make your asthma worse (asthma triggers).  Tracking your level of asthma control.  Responding to worsening asthma.  Seeking emergency care when needed. To track your asthma, keep records of your symptoms, check your peak flow number using a handheld device that shows how well air moves out of your lungs (peak flow meter), and get regular asthma checkups.  WHAT ARE SOME WAYS TO PREVENT AN ASTHMA ATTACK?  Take medicines as directed by your health care provider.  Keep track of your asthma symptoms and level of control.  With your health care provider, write a detailed plan for taking medicines and managing an asthma attack. Then be sure to follow your action plan. Asthma is an ongoing condition that needs regular monitoring and treatment.  Identify and avoid asthma triggers. Many outdoor allergens and irritants (such as pollen, mold, cold air, and air pollution) can trigger asthma attacks. Find out what your asthma triggers are and take steps to avoid them.  Monitor your breathing. Learn to recognize warning signs of an attack, such as coughing, wheezing, or shortness of breath. Your lung function may decrease before you notice any signs or symptoms, so regularly measure  and record your peak airflow with a home peak flow meter.  Identify and treat attacks early. If you act quickly, you are less likely to have a severe attack. You will also need less medicine to control your symptoms. When your peak flow measurements decrease and alert you to an upcoming attack, take your medicine as instructed and immediately stop any activity that may have triggered the attack. If  your symptoms do not improve, get medical help.  Pay attention to increasing quick-relief inhaler use. If you find yourself relying on your quick-relief inhaler, your asthma is not under control. See your health care provider about adjusting your treatment. WHAT CAN MAKE MY SYMPTOMS WORSE? A number of common things can set off or make your asthma symptoms worse and cause temporary increased inflammation of your airways. Keep track of your asthma symptoms for several weeks, detailing all the environmental and emotional factors that are linked with your asthma. When you have an asthma attack, go back to your asthma diary to see which factor, or combination of factors, might have contributed to it. Once you know what these factors are, you can take steps to control many of them. If you have allergies and asthma, it is important to take asthma prevention steps at home. Minimizing contact with the substance to which you are allergic will help prevent an asthma attack. Some triggers and ways to avoid these triggers are: Animal Dander:  Some people are allergic to the flakes of skin or dried saliva from animals with fur or feathers.   There is no such thing as a hypoallergenic dog or cat breed. All dogs or cats can cause allergies, even if they don't shed.  Keep these pets out of your home.  If you are not able to keep a pet outdoors, keep the pet out of your bedroom and other sleeping areas at all times, and keep the door closed.  Remove carpets and furniture covered with cloth from your home. If that is not possible, keep the pet away from fabric-covered furniture and carpets. Dust Mites: Many people with asthma are allergic to dust mites. Dust mites are tiny bugs that are found in every home in mattresses, pillows, carpets, fabric-covered furniture, bedcovers, clothes, stuffed toys, and other fabric-covered items.   Cover your mattress in a special dust-proof cover.  Cover your pillow in a special  dust-proof cover, or wash the pillow each week in hot water. Water must be hotter than 130 F (54.4 C) to kill dust mites. Cold or warm water used with detergent and bleach can also be effective.  Wash the sheets and blankets on your bed each week in hot water.  Try not to sleep or lie on cloth-covered cushions.  Call ahead when traveling and ask for a smoke-free hotel room. Bring your own bedding and pillows in case the hotel only supplies feather pillows and down comforters, which may contain dust mites and cause asthma symptoms.  Remove carpets from your bedroom and those laid on concrete, if you can.  Keep stuffed toys out of the bed, or wash the toys weekly in hot water or cooler water with detergent and bleach. Cockroaches: Many people with asthma are allergic to the droppings and remains of cockroaches.   Keep food and garbage in closed containers. Never leave food out.  Use poison baits, traps, powders, gels, or paste (for example, boric acid).  If a spray is used to kill cockroaches, stay out of the room until the odor  goes away. Indoor Mold:  Fix leaky faucets, pipes, or other sources of water that have mold around them.  Clean floors and moldy surfaces with a fungicide or diluted bleach.  Avoid using humidifiers, vaporizers, or swamp coolers. These can spread molds through the air. Pollen and Outdoor Mold:  When pollen or mold spore counts are high, try to keep your windows closed.  Stay indoors with windows closed from late morning to afternoon. Pollen and some mold spore counts are highest at that time.  Ask your health care provider whether you need to take anti-inflammatory medicine or increase your dose of the medicine before your allergy season starts. Other Irritants to Avoid:  Tobacco smoke is an irritant. If you smoke, ask your health care provider how you can quit. Ask family members to quit smoking, too. Do not allow smoking in your home or car.  If  possible, do not use a wood-burning stove, kerosene heater, or fireplace. Minimize exposure to all sources of smoke, including incense, candles, fires, and fireworks.  Try to stay away from strong odors and sprays, such as perfume, talcum powder, hair spray, and paints.  Decrease humidity in your home and use an indoor air cleaning device. Reduce indoor humidity to below 60%. Dehumidifiers or central air conditioners can do this.  Decrease house dust exposure by changing furnace and air cooler filters frequently.  Try to have someone else vacuum for you once or twice a week. Stay out of rooms while they are being vacuumed and for a short while afterward.  If you vacuum, use a dust mask from a hardware store, a double-layered or microfilter vacuum cleaner bag, or a vacuum cleaner with a HEPA filter.  Sulfites in foods and beverages can be irritants. Do not drink beer or wine or eat dried fruit, processed potatoes, or shrimp if they cause asthma symptoms.  Cold air can trigger an asthma attack. Cover your nose and mouth with a scarf on cold or windy days.  Several health conditions can make asthma more difficult to manage, including a runny nose, sinus infections, reflux disease, psychological stress, and sleep apnea. Work with your health care provider to manage these conditions.  Avoid close contact with people who have a respiratory infection such as a cold or the flu, since your asthma symptoms may get worse if you catch the infection. Wash your hands thoroughly after touching items that may have been handled by people with a respiratory infection.  Get a flu shot every year to protect against the flu virus, which often makes asthma worse for days or weeks. Also get a pneumonia shot if you have not previously had one. Unlike the flu shot, the pneumonia shot does not need to be given yearly. Medicines:  Talk to your health care provider about whether it is safe for you to take aspirin or  non-steroidal anti-inflammatory medicines (NSAIDs). In a small number of people with asthma, aspirin and NSAIDs can cause asthma attacks. These medicines must be avoided by people who have known aspirin-sensitive asthma. It is important that people with aspirin-sensitive asthma read labels of all over-the-counter medicines used to treat pain, colds, coughs, and fever.  Beta-blockers and ACE inhibitors are other medicines you should discuss with your health care provider. HOW CAN I FIND OUT WHAT I AM ALLERGIC TO? Ask your asthma health care provider about allergy skin testing or blood testing (the RAST test) to identify the allergens to which you are sensitive. If you are found to  have allergies, the most important thing to do is to try to avoid exposure to any allergens that you are sensitive to as much as possible. Other treatments for allergies, such as medicines and allergy shots (immunotherapy) are available.  CAN I EXERCISE? Follow your health care provider's advice regarding asthma treatment before exercising. It is important to maintain a regular exercise program, but vigorous exercise or exercise in cold, humid, or dry environments can cause asthma attacks, especially for those people who have exercise-induced asthma. Document Released: 10/03/2009 Document Revised: 10/20/2013 Document Reviewed: 04/22/2013 [facility] Patient Information 2015 [place], [place]. This information is not intended to replace advice given to you by your health care provider. Make sure you discuss any questions you have with your health care provider. Asthma Asthma is a condition of the lungs in which the airways tighten and narrow. Asthma can make it hard to breathe. Asthma cannot be cured, but medicine and lifestyle changes can help control it. Asthma may be started (triggered) by:  Animal skin flakes (dander).  Dust.  Cockroaches.  Pollen.  Mold.  Smoke.  Cleaning products.  Hair sprays or aerosol  sprays.  Paint fumes or strong smells.  Cold air, weather changes, and winds.  Crying or laughing hard.  Stress.  Certain medicines or drugs.  Foods, such as dried fruit, potato chips, and sparkling grape juice.  Infections or conditions (colds, flu).  Exercise.  Certain medical conditions or diseases.  Exercise or tiring activities. HOME CARE   Take medicine as told by your doctor.  Use a peak flow meter as told by your doctor. A peak flow meter is a tool that measures how well the lungs are working.  Record and keep track of the peak flow meter's readings.  Understand and use the asthma action plan. An asthma action plan is a written plan for taking care of your asthma and treating your attacks.  To help prevent asthma attacks:  Do not smoke. Stay away from secondhand smoke.  Change your heating and air conditioning filter often.  Limit your use of fireplaces and wood stoves.  Get rid of pests (such as roaches and mice) and their droppings.  Throw away plants if you see mold on them.  Clean your floors. Dust regularly. Use cleaning products that do not smell.  Have someone vacuum when you are not home. Use a vacuum cleaner with a HEPA filter if possible.  Replace carpet with wood, tile, or vinyl flooring. Carpet can trap animal skin flakes and dust.  Use allergy-proof pillows, mattress covers, and box spring covers.  Wash bed sheets and blankets every week in hot water and dry them in a dryer.  Use blankets that are made of polyester or cotton.  Clean bathrooms and kitchens with bleach. If possible, have someone repaint the walls in these rooms with mold-resistant paint. Keep out of the rooms that are being cleaned and painted.  Wash hands often. GET HELP IF:  You have make a whistling sound when breaking (wheeze), have shortness of breath, or have a cough even if taking medicine to prevent attacks.  The colored mucus you cough up (sputum) is thicker  than usual.  The colored mucus you cough up changes from clear or white to yellow, green, gray, or bloody.  You have problems from the medicine you are taking such as:  A rash.  Itching.  Swelling.  Trouble breathing.  You need reliever medicines more than 2-3 times a week.  Your peak flow measurement  is still at 50-79% of your personal best after following the action plan for 1 hour.  You have a fever. GET HELP RIGHT AWAY IF:   You seem to be worse and are not responding to medicine during an asthma attack.  You are short of breath even at rest.  You get short of breath when doing very little activity.  You have trouble eating, drinking, or talking.  You have chest pain.  You have a fast heartbeat.  Your lips or fingernails start to turn blue.  You are light-headed, dizzy, or faint.  Your peak flow is less than 50% of your personal best. MAKE SURE YOU:   Understand these instructions.  Will watch your condition.  Will get help right away if you are not doing well or get worse. Document Released: 04/02/2008 Document Revised: 03/01/2014 Document Reviewed: 05/14/2013 [facility] Patient Information 2015 [place], [place]. This information is not intended to replace advice given to you by your health care provider. Make sure you discuss any questions you have with your health care provider.

## 2014-12-09 NOTE — Progress Notes (Signed)
Subjective:    Patient ID: Kristin SharpsKhadijah Bentley, female    DOB: 1991/09/24, 24 y.o.   MRN: [id number]  HPI  [name] is a 24 year old female with a history of asthma and ADHD that presents to establish care. She states that she was a patient of Dr. Burton Apleyonald Roberts prior to establishing care. She reports that she has not been to a primary physician since October 2015.   [name] reports a history of asthma. She states that her last asthma exacerbation was in November 2015, which was triggered by bronchitis. She is currently assymptomatic and symptoms primarily occurs with extreme cold and exercise.   Observed precipitants include cold air, pollens and upper respiratory infection.  Current limitations in activity from asthma: none.  She has not had any emergency room visits in the past month.  Patient is currently taking Dulera daily and using albuterol rescue inhaler as needed. She denies fatigue, weakness, chest pain, coughing, wheezing, or shortness of breath.    Past Medical History  Diagnosis Date  . Asthma   . ADHD (attention deficit hyperactivity disorder)    History   Social History  . Marital Status: Single    Spouse Name: N/A  . Number of Children: N/A  . Years of Education: N/A   Occupational History  . Not on file.   Social History Main Topics  . Smoking status: Never Smoker   . Smokeless tobacco: Not on file  . Alcohol Use: No  . Drug Use: No  . Sexual Activity: Not on file   Other Topics Concern  . Not on file   Social History Narrative   Allergies  Allergen Reactions  . Bee Venom Anaphylaxis  . Citrus Swelling    Review of Systems  Constitutional: Negative.   HENT: Negative.   Eyes: Negative.   Respiratory: Negative.   Cardiovascular: Negative.   Gastrointestinal: Negative.   Endocrine: Negative.   Genitourinary: Negative.   Musculoskeletal: Negative.   Skin: Negative.   Allergic/Immunologic: Positive for environmental  allergies and food allergies.  Neurological: Negative.   Hematological: Negative.   Psychiatric/Behavioral: Negative.        Objective:   Physical Exam  Constitutional: She is oriented to person, place, and time. Vital signs are normal. She appears well-developed and well-nourished.  HENT:  Head: Normocephalic.  Right Ear: External ear normal.  Mouth/Throat: Oropharynx is clear and moist.  Eyes: Conjunctivae are normal. Pupils are equal, round, and reactive to light.  Neck: Normal range of motion. Neck supple.  Cardiovascular: Normal rate, regular rhythm, normal heart sounds and intact distal pulses.   Pulmonary/Chest: Effort normal and breath sounds normal.  Abdominal: Soft. Bowel sounds are normal.  Musculoskeletal: Normal range of motion.  Neurological: She is alert and oriented to person, place, and time. She has normal reflexes.  Skin: Skin is warm and dry.  Psychiatric: She has a normal mood and affect. Her behavior is normal. Judgment and thought content normal.         BP 131/71 mmHg  Pulse 89  Temp(Src) 98.1 F (36.7 C) (Oral)  Resp 16  Ht 5\' 7"  (1.702 m)  Wt 247 lb (112.038 kg)  BMI 38.68 kg/m2  LMP 12/06/2014 Assessment & Plan:  1. Asthma, chronic, mild intermittent, uncomplicated Asthma is controlled on current medication regimen. She states that she has not required the use of her rescue inhaler since November. She has not been using Dulera correctly. She has only been using 2 puffs  daily. Recommended dose is 2 puffs BID. The patient and I discussed discussed medication regimen at length. Re-started Singular 10 daily to prevent allergy associated exacerbations.   - montelukast (SINGULAIR) 10 MG tablet; Take 1 tablet (10 mg total) by mouth at bedtime.  Dispense: 30 tablet; Refill: 2 - mometasone-formoterol (DULERA) 200-5 MCG/ACT AERO; Inhale 2 puffs into the lungs 2 (two) times daily.  Dispense: 1 Inhaler; Refill: 1 - albuterol (PROVENTIL HFA;VENTOLIN HFA) 108  (90 BASE) MCG/ACT inhaler; Inhale 2 puffs into the lungs every 6 (six) hours as needed for wheezing or shortness of breath.  Dispense: 1 Inhaler; Refill: 1  2. History of attention deficit hyperactivity disorder (ADHD) Patient has a history of ADHD, but is currently not being treated. She states that she has not been on medications in years. ASRS -v1.1 completed. Patient is not a candidate for treatment at this time.   3. Metabolic syndrome - TSH - Hemoglobin A1c - COMPLETE METABOLIC PANEL WITH GFR   4. Obesity Current BMI is 38.8. Recommend a low fat, low carbohydrate diet divided over 6 small meals, walking 2-3 times per week at a moderate pace, increase water intake to 6-8 glasses per day.   5. Need for immunization against influenza - Flu Vaccine QUAD 36+ mos IM (Fluarix)  Preventative:  Vaccinations: UTD with influenza and pneumococcal, she will need Prevnar 13 in 1 year. Unable to check the [place] immunization registry due to technical issues.  She is sexually active, uses barrier protection Does not perform monthly self breast exam She has never had a pap smear/ pelvic examination Sareena Odeh M, FNP  RTC: 3 months for CPE with Dr. Ashley Royalty

## 2014-12-10 LAB — HEMOGLOBIN A1C
HEMOGLOBIN A1C: 5.4 % (ref <5.7)
Mean Plasma Glucose: 108 mg/dL (ref <117)

## 2014-12-10 LAB — COMPLETE METABOLIC PANEL WITH GFR
ALK PHOS: 98 U/L (ref 39–117)
ALT: 15 U/L (ref 0–35)
AST: 16 U/L (ref 0–37)
Albumin: 4.1 g/dL (ref 3.5–5.2)
BILIRUBIN TOTAL: 0.3 mg/dL (ref 0.2–1.2)
BUN: 10 mg/dL (ref 6–23)
CHLORIDE: 99 meq/L (ref 96–112)
CO2: 25 meq/L (ref 19–32)
CREATININE: 0.6 mg/dL (ref 0.50–1.10)
Calcium: 9.5 mg/dL (ref 8.4–10.5)
GFR, Est Non African American: >89 mL/min
Glucose, Bld: 84 mg/dL (ref 70–99)
Potassium: 3.9 mEq/L (ref 3.5–5.3)
Sodium: 135 mEq/L (ref 135–145)
Total Protein: 7.3 g/dL (ref 6.0–8.3)

## 2014-12-10 LAB — TSH: TSH: 1.865 u[IU]/mL (ref 0.350–4.500)

## 2014-12-14 DIAGNOSIS — J45909 Unspecified asthma, uncomplicated: Secondary | ICD-10-CM | POA: Insufficient documentation

## 2014-12-14 DIAGNOSIS — E8881 Metabolic syndrome: Secondary | ICD-10-CM | POA: Insufficient documentation

## 2014-12-14 DIAGNOSIS — Z8659 Personal history of other mental and behavioral disorders: Secondary | ICD-10-CM | POA: Insufficient documentation

## 2015-01-05 ENCOUNTER — Emergency Department (HOSPITAL_COMMUNITY)
Admission: EM | Admit: 2015-01-05 | Discharge: 2015-01-05 | Disposition: A | Discharge status: Home / Self Care | Payer: Medicaid Other | Attending: Emergency Medicine | Admitting: Emergency Medicine

## 2015-01-05 ENCOUNTER — Encounter (HOSPITAL_COMMUNITY): Payer: Self-pay | Admitting: Emergency Medicine

## 2015-01-05 DIAGNOSIS — Z79899 Other long term (current) drug therapy: Secondary | ICD-10-CM | POA: Insufficient documentation

## 2015-01-05 DIAGNOSIS — Z8659 Personal history of other mental and behavioral disorders: Secondary | ICD-10-CM | POA: Diagnosis not present

## 2015-01-05 DIAGNOSIS — J45909 Unspecified asthma, uncomplicated: Secondary | ICD-10-CM | POA: Insufficient documentation

## 2015-01-05 DIAGNOSIS — H00013 Hordeolum externum right eye, unspecified eyelid: Secondary | ICD-10-CM | POA: Insufficient documentation

## 2015-01-05 DIAGNOSIS — H5711 Ocular pain, right eye: Secondary | ICD-10-CM | POA: Diagnosis present

## 2015-01-05 DIAGNOSIS — H00012 Hordeolum externum right lower eyelid: Secondary | ICD-10-CM

## 2015-01-05 MED ORDER — ERYTHROMYCIN 5 MG/GM OP OINT
1.0000 "application " | TOPICAL_OINTMENT | Freq: Once | OPHTHALMIC | Status: AC
  Administered 2015-01-05: 1 via OPHTHALMIC
  Filled 2015-01-05: qty 3.5

## 2015-01-05 MED ORDER — HYDROXYZINE HCL 25 MG PO TABS: 25.0000 mg | ORAL_TABLET | Freq: Four times a day (QID) | ORAL | Status: DC

## 2015-01-05 NOTE — ED Provider Notes (Signed)
CSN: [id number]     Arrival date & time 01/05/15  4098 History   First MD Initiated Contact with Patient 01/05/15 [phone]     Chief Complaint  Patient presents with  . Eye Pain    right     (Consider location/radiation/quality/duration/timing/severity/associated sxs/prior Treatment) HPI Kristin Bentley is a 24 y.o. female with hx of asthma, presents to ED with right lower lid swelling. Pt states swelling started 2 days ago. Stats pain with palpation of the eye lid, with blinking. Sates it is red and swollen. She states it is also very itchy. She tried to do warm compresses but states it makes the itching worse. She used over-the-counter eyedrops with no improvement. States she has a history of stye, but usually it would go away in 2 days. She denies any visual changes. She does not wear contacts or glasses. There are no injuries to the eye. She denies any fever, chills. No drainage.   Past Medical History  Diagnosis Date  . Asthma   . ADHD (attention deficit hyperactivity disorder)    History reviewed. No pertinent past surgical history. History reviewed. No pertinent family history. History  Substance Use Topics  . Smoking status: Never Smoker   . Smokeless tobacco: Not on file  . Alcohol Use: No   OB History    No data available     Review of Systems  Constitutional: Negative for fever and chills.  HENT: Negative for congestion.   Eyes: Positive for pain, redness and itching. Negative for photophobia, discharge and visual disturbance.  Respiratory: Negative for cough, chest tightness and shortness of breath.   Cardiovascular: Negative for chest pain, palpitations and leg swelling.  Musculoskeletal: Negative for myalgias, arthralgias, neck pain and neck stiffness.  Skin: Negative for rash.  Neurological: Negative for headaches.  All other systems reviewed and are negative.     Allergies  Bee venom and Citrus  Home Medications   Prior to Admission medications    Medication Sig Start Date End Date Taking? Authorizing Provider  albuterol (PROVENTIL HFA;VENTOLIN HFA) 108 (90 BASE) MCG/ACT inhaler Inhale 2 puffs into the lungs every 6 (six) hours as needed for wheezing or shortness of breath. 12/09/14   Massie Maroon, FNP  EPINEPHrine (EPIPEN) 0.3 mg/0.3 mL SOAJ injection Inject 0.3 mg into the muscle as needed (for reaction).    Historical Provider, MD  mometasone-formoterol (DULERA) 200-5 MCG/ACT AERO Inhale 2 puffs into the lungs 2 (two) times daily. 12/09/14   Massie Maroon, FNP  montelukast (SINGULAIR) 10 MG tablet Take 1 tablet (10 mg total) by mouth at bedtime. 12/09/14   Massie Maroon, FNP   BP 139/77 mmHg  Pulse 77  Temp(Src) 98.9 F (37.2 C)  Resp 16  SpO2 100%  LMP 12/06/2014 Physical Exam  Constitutional: She appears well-developed and well-nourished. No distress.  Eyes: Pupils are equal, round, and reactive to light.  Mild swelling to the right lower lid with central pustule. Ttp. No pain with extraoccular movements  Neck: Neck supple.  Neurological: She is alert.  Skin: Skin is warm and dry.  Nursing note and vitals reviewed.   ED Course  Procedures (including critical care time) Labs Review Labs Reviewed - No data to display  Imaging Review No results found.   EKG Interpretation None      MDM   Final diagnoses:  Hordeolum externum of right lower eyelid    patient's exam was consistent with hordeolum. She is afebrile. No drainage from her eye.  Visual acuity in the right eye is 20/40. Left eye is actually worse 20/50. She will need to follow-up with ophthalmology regarding her visual acuity, I will give her referral. Will treat with erythromycin ointment topically, warm compresses at home, antihistamines for itching. Follow-up with eye specialist.  Filed Vitals:   01/05/15 0924  BP: 139/77  Pulse: 77  Temp: 98.9 F (37.2 C)  Resp: 16  SpO2: 100%       Jaynie Crumbleatyana Davette Nugent, PA-C 01/05/15 1704  Derwood KaplanAnkit  Nanavati, MD 01/07/15 [phone]

## 2015-01-05 NOTE — Discharge Instructions (Signed)
Apply erythromycin ointment every 6 hrs. Vistaril for itching. Warm compresses several times a day. Follow up with eye specialist if not improving.    Sty A sty (hordeolum) is an infection of a gland in the eyelid located at the base of the eyelash. A sty may develop a white or yellow head of pus. It can be puffy (swollen). Usually, the sty will burst and pus will come out on its own. They do not leave lumps in the eyelid once they drain. A sty is often confused with another form of cyst of the eyelid called a chalazion. Chalazions occur within the eyelid and not on the edge where the bases of the eyelashes are. They often are red, sore and then form firm lumps in the eyelid. CAUSES   Germs (bacteria).  Lasting (chronic) eyelid inflammation. SYMPTOMS   Tenderness, redness and swelling along the edge of the eyelid at the base of the eyelashes.  Sometimes, there is a white or yellow head of pus. It may or may not drain. DIAGNOSIS  An ophthalmologist will be able to distinguish between a sty and a chalazion and treat the condition appropriately.  TREATMENT   Styes are typically treated with warm packs (compresses) until drainage occurs.  In rare cases, medicines that kill germs (antibiotics) may be prescribed. These antibiotics may be in the form of drops, cream or pills.  If a hard lump has formed, it is generally necessary to do a small incision and remove the hardened contents of the cyst in a minor surgical procedure done in the office.  In suspicious cases, your caregiver may send the contents of the cyst to the lab to be certain that it is not a rare, but dangerous form of cancer of the glands of the eyelid. HOME CARE INSTRUCTIONS   Wash your hands often and dry them with a clean towel. Avoid touching your eyelid. This may spread the infection to other parts of the eye.  Apply heat to your eyelid for 10 to 20 minutes, several times a day, to ease pain and help to heal it  faster.  Do not squeeze the sty. Allow it to drain on its own. Wash your eyelid carefully 3 to 4 times per day to remove any pus. SEEK IMMEDIATE MEDICAL CARE IF:   Your eye becomes painful or puffy (swollen).  Your vision changes.  Your sty does not drain by itself within 3 days.  Your sty comes back within a short period of time, even with treatment.  You have redness (inflammation) around the eye.  You have a fever. Document Released: 07/25/2005 Document Revised: 01/07/2012 Document Reviewed: 01/29/2014 [facility] Patient Information 2015 [employer], [place]. This information is not intended to replace advice given to you by your health care provider. Make sure you discuss any questions you have with your health care provider.

## 2015-01-05 NOTE — ED Notes (Signed)
Pt reports having a stye right eye with itching and irritation. Pt A&OX4,NAD noted.

## 2015-01-05 NOTE — ED Notes (Addendum)
Visual Acuity Screen   Left eye 20/50 Right eye 20/40

## 2015-03-10 ENCOUNTER — Ambulatory Visit (INDEPENDENT_AMBULATORY_CARE_PROVIDER_SITE_OTHER): Payer: Medicaid Other | Admitting: Internal Medicine

## 2015-03-10 ENCOUNTER — Encounter: Payer: Self-pay | Admitting: Internal Medicine

## 2015-03-10 VITALS — BP 141/83 | HR 85 | Temp 98.2°F | Resp 16 | Ht 67.0 in | Wt 239.0 lb

## 2015-03-10 DIAGNOSIS — R739 Hyperglycemia, unspecified: Secondary | ICD-10-CM | POA: Diagnosis not present

## 2015-03-10 DIAGNOSIS — Z23 Encounter for immunization: Secondary | ICD-10-CM

## 2015-03-10 DIAGNOSIS — Z9189 Other specified personal risk factors, not elsewhere classified: Secondary | ICD-10-CM

## 2015-03-10 DIAGNOSIS — I10 Essential (primary) hypertension: Secondary | ICD-10-CM

## 2015-03-10 DIAGNOSIS — D509 Iron deficiency anemia, unspecified: Secondary | ICD-10-CM

## 2015-03-10 DIAGNOSIS — E669 Obesity, unspecified: Secondary | ICD-10-CM | POA: Diagnosis not present

## 2015-03-10 DIAGNOSIS — Z87898 Personal history of other specified conditions: Secondary | ICD-10-CM

## 2015-03-10 LAB — CBC WITH DIFFERENTIAL/PLATELET
Basophils Absolute: 0.1 10*3/uL (ref 0.0–0.1)
Basophils Relative: 1 % (ref 0–1)
EOS ABS: 0.2 10*3/uL (ref 0.0–0.7)
EOS PCT: 2 % (ref 0–5)
HCT: 34.3 % — ABNORMAL LOW (ref 36.0–46.0)
Hemoglobin: 9.9 g/dL — ABNORMAL LOW (ref 12.0–15.0)
LYMPHS ABS: 3.6 10*3/uL (ref 0.7–4.0)
Lymphocytes Relative: 43 % (ref 12–46)
MCH: 18.5 pg — ABNORMAL LOW (ref 26.0–34.0)
MCHC: 28.9 g/dL — ABNORMAL LOW (ref 30.0–36.0)
MCV: 64 fL — AB (ref 78.0–100.0)
MONO ABS: 0.5 10*3/uL (ref 0.1–1.0)
MONOS PCT: 6 % (ref 3–12)
MPV: 10.2 fL (ref 8.6–12.4)
NEUTROS ABS: 4 10*3/uL (ref 1.7–7.7)
Neutrophils Relative %: 48 % (ref 43–77)
Platelets: 337 10*3/uL (ref 150–400)
RBC: 5.36 MIL/uL — ABNORMAL HIGH (ref 3.87–5.11)
RDW: 18.6 % — AB (ref 11.5–15.5)
WBC: 8.4 10*3/uL (ref 4.0–10.5)

## 2015-03-10 MED ORDER — AMLODIPINE BESYLATE 5 MG PO TABS: 5.0000 mg | ORAL_TABLET | Freq: Every day | ORAL | Status: DC

## 2015-03-10 NOTE — Progress Notes (Signed)
Patient ID: Kristin Bentley, female   DOB: 1991-07-01, 24 y.o.   MRN: [id number]   Kristin Bentley, is a 24 y.o. female  [id number]  [id number]  DOB - 1991-07-01  CC:  Chief Complaint  Patient presents with  . Follow-up    obesity  . Anemia       HPI: Kristin Bentley is a 24 y.o. female here today to follow up on obesity and Anemia. She states that she has been told in the past that she has anemia but was never prescribed Iron. She has moderate flow during menses. She is currently menstruating. A review of her records shows microcytosis and hypochromia.  She also is obese and at risk for diabetes with a strong family history diabetes II in her mother and father. We have discussed strategies for weight loss and carbohydrate control.   Additionally a review of her blood pressure shows a trending upward of BP top a level of stage 1 hypertension.   Patient has No headache, No chest pain, No abdominal pain - No Nausea, No new weakness tingling or numbness, No Cough - SOB.  Allergies  Allergen Reactions  . Bee Venom Anaphylaxis  . Citrus Swelling   Past Medical History  Diagnosis Date  . Asthma   . ADHD (attention deficit hyperactivity disorder)    Current Outpatient Prescriptions on File Prior to Visit  Medication Sig Dispense Refill  . albuterol (PROVENTIL HFA;VENTOLIN HFA) 108 (90 BASE) MCG/ACT inhaler Inhale 2 puffs into the lungs every 6 (six) hours as needed for wheezing or shortness of breath. 1 Inhaler 1  . EPINEPHrine (EPIPEN) 0.3 mg/0.3 mL SOAJ injection Inject 0.3 mg into the muscle as needed (for reaction).    . hydrOXYzine (ATARAX/VISTARIL) 25 MG tablet Take 1 tablet (25 mg total) by mouth every 6 (six) hours. 12 tablet 0  . montelukast (SINGULAIR) 10 MG tablet Take 1 tablet (10 mg total) by mouth at bedtime. 30 tablet 2  . mometasone-formoterol (DULERA) 200-5 MCG/ACT AERO Inhale 2 puffs into the lungs 2 (two) times daily. (Patient not taking:  Reported on 03/10/2015) 1 Inhaler 1   No current facility-administered medications on file prior to visit.   No family history on file. History   Social History  . Marital Status: Single    Spouse Name: N/A  . Number of Children: N/A  . Years of Education: N/A   Occupational History  . Not on file.   Social History Main Topics  . Smoking status: Never Smoker   . Smokeless tobacco: Not on file  . Alcohol Use: No  . Drug Use: No  . Sexual Activity: Not on file   Other Topics Concern  . Not on file   Social History Narrative    Review of Systems: Constitutional: Negative for fever, chills, diaphoresis, activity change, appetite change and fatigue. HENT: Negative for ear pain, nosebleeds, congestion, facial swelling, rhinorrhea, neck pain, neck stiffness and ear discharge.  Eyes: Negative for pain, discharge, redness, itching and visual disturbance. Respiratory: Negative for cough, choking, chest tightness, shortness of breath, wheezing and stridor.  Cardiovascular: Negative for chest pain, palpitations and leg swelling. Gastrointestinal: Negative for abdominal distention. Genitourinary: Negative for dysuria, urgency, frequency, hematuria, flank pain, decreased urine volume, difficulty urinating and dyspareunia.  Musculoskeletal: Negative for back pain, joint swelling, arthralgia and gait problem. Neurological: Negative for dizziness, tremors, seizures, syncope, facial asymmetry, speech difficulty, weakness, light-headedness, numbness and headaches.  Hematological: Negative for adenopathy. Does not bruise/bleed easily. Psychiatric/Behavioral: Negative for hallucinations,  behavioral problems, confusion, dysphoric mood, decreased concentration and agitation.     Objective:   Filed Vitals:   03/10/15 1418  BP: 141/83  Pulse: 85  Temp: 98.2 F (36.8 C)  Resp: 16    Physical Exam: Constitutional: Patient appearsobese No distress. HENT: Normocephalic, atraumatic,  External right and left ear normal. Oropharynx is clear and moist.  Eyes: Conjunctivae and EOM are normal. PERRLA, no scleral icterus. Neck: Normal ROM. Neck supple. No JVD. No tracheal deviation. No thyromegaly. CVS: RRR, S1/S2 +, no murmurs, no gallops, no carotid bruit.  Pulmonary: Effort and breath sounds normal, no stridor, rhonchi, wheezes, rales.  Abdominal: Soft. BS +, no distension, tenderness, rebound or guarding.  Musculoskeletal: Normal range of motion. No edema and no tenderness.  Lymphadenopathy: No lymphadenopathy noted, cervical, inguinal or axillary Neuro: Alert. Normal reflexes, muscle tone coordination. No cranial nerve deficit. Skin: Skin is warm and dry. No rash noted. Not diaphoretic. No erythema. No pallor. Psychiatric: Normal mood and affect. Behavior, judgment, thought content normal.   Lab Results  Component Value Date   WBC 8.8 09/23/2014   HGB 10.9* 09/23/2014   HCT 35.8* 09/23/2014   MCV 65.9* 09/23/2014   PLT 319 09/23/2014   Lab Results  Component Value Date   CREATININE 0.60 12/09/2014   BUN 10 12/09/2014   NA 135 12/09/2014   K 3.9 12/09/2014   CL 99 12/09/2014   CO2 25 12/09/2014    Lab Results  Component Value Date   HGBA1C 5.4 12/09/2014   Lipid Panel  No results found for: CHOL, TRIG, HDL, CHOLHDL, VLDL, LDLCALC     Assessment and plan:   1. Hypochromic microcytic anemia - A review of her records shows microcytic, hypochromic anemia. Will check iron levels and evaluate for Iron deficiency anemia - CBC with Differential/Platelet - IBC Panel  2. Metabolic Syndrome/Hyperglycemia - Pt noted to have elevated blood sugars in a setting of metabolic syndrome. Will check Hb A1c and start plan of substituting water instead of sodas and sweet tea and doing 30 minutes of aerobic exercise daily. - Hemoglobin A1C  3. Obesity -  start plan of substituting water instead of sodas and sweet tea and doing 30 minutes of aerobic exercise daily. -  Lipid panel  4. Essential hypertension - Pt has had elevated BP in the past but this has not been treated. Will start on Norvasc 5mg  - amLODipine (NORVASC) 5 MG tablet; Take 1 tablet (5 mg total) by mouth daily.  Dispense: 90 tablet; Refill: 3  5. At risk for osteoporosis - Vitamin D, 25-hydroxy   Return in about 2 weeks (around 03/24/2015) for Annual Physical, HTN.  The patient was given clear instructions to go to ER or return to medical center if symptoms don't improve, worsen or new problems develop. The patient verbalized understanding. The patient was told to call to get lab results if they haven't heard anything in the next week.     This note has been created with Education officer, environmentalDragon speech recognition software and smart phrase technology. Any transcriptional errors are unintentional.    Landyn Buckalew A., MD [facility] [place], [place] [phone]   03/10/2015, 3:00 PM

## 2015-03-11 LAB — HEMOGLOBIN A1C
HEMOGLOBIN A1C: 5.5 % (ref <5.7)
Mean Plasma Glucose: 111 mg/dL (ref <117)

## 2015-03-11 LAB — VITAMIN D 25 HYDROXY (VIT D DEFICIENCY, FRACTURES): Vit D, 25-Hydroxy: 13 ng/mL — ABNORMAL LOW (ref 30–100)

## 2015-03-14 LAB — IBC PANEL
%SAT: 4 % — ABNORMAL LOW (ref 20–55)
TIBC: 367 ug/dL (ref 250–470)
UIBC: 352 ug/dL (ref 125–400)

## 2015-03-14 LAB — LIPID PANEL
CHOL/HDL RATIO: 3.1 ratio
Cholesterol: 157 mg/dL (ref 0–200)
HDL: 50 mg/dL (ref >=46)
LDL Cholesterol: 94 mg/dL (ref 0–99)
Triglycerides: 67 mg/dL (ref <150)
VLDL: 13 mg/dL (ref 0–40)

## 2015-03-14 LAB — IRON: IRON: 15 ug/dL — AB (ref 42–145)

## 2015-03-29 ENCOUNTER — Telehealth: Payer: Self-pay | Admitting: Internal Medicine

## 2015-03-29 NOTE — Telephone Encounter (Signed)
Patient will need to be evaluated for ankle pain before a referral can be made. Patient has an appointment for 03/31/2015, this can be addressed then. Thanks!

## 2015-03-29 NOTE — Telephone Encounter (Signed)
Patient called stating she needs a [facility]. Patient states her previous provider sent the initial referral.

## 2015-03-31 ENCOUNTER — Ambulatory Visit (INDEPENDENT_AMBULATORY_CARE_PROVIDER_SITE_OTHER): Payer: Medicaid Other | Admitting: Internal Medicine

## 2015-03-31 ENCOUNTER — Other Ambulatory Visit (HOSPITAL_COMMUNITY)
Admission: RE | Admit: 2015-03-31 | Discharge: 2015-03-31 | Disposition: A | Discharge status: Home / Self Care | Payer: Medicaid Other | Source: Ambulatory Visit | Attending: Internal Medicine | Admitting: Internal Medicine

## 2015-03-31 ENCOUNTER — Other Ambulatory Visit: Payer: Self-pay | Admitting: Family Medicine

## 2015-03-31 VITALS — BP 122/68 | HR 90 | Temp 98.6°F | Resp 16 | Ht 67.0 in | Wt 239.0 lb

## 2015-03-31 DIAGNOSIS — N898 Other specified noninflammatory disorders of vagina: Secondary | ICD-10-CM

## 2015-03-31 DIAGNOSIS — Z1151 Encounter for screening for human papillomavirus (HPV): Secondary | ICD-10-CM | POA: Insufficient documentation

## 2015-03-31 DIAGNOSIS — Z01411 Encounter for gynecological examination (general) (routine) with abnormal findings: Secondary | ICD-10-CM | POA: Diagnosis present

## 2015-03-31 DIAGNOSIS — B373 Candidiasis of vulva and vagina: Secondary | ICD-10-CM

## 2015-03-31 DIAGNOSIS — IMO0001 Reserved for inherently not codable concepts without codable children: Secondary | ICD-10-CM

## 2015-03-31 DIAGNOSIS — Z0000 Encounter for general adult medical examination without abnormal findings: Secondary | ICD-10-CM

## 2015-03-31 DIAGNOSIS — B9689 Other specified bacterial agents as the cause of diseases classified elsewhere: Secondary | ICD-10-CM

## 2015-03-31 DIAGNOSIS — E8881 Metabolic syndrome: Secondary | ICD-10-CM

## 2015-03-31 DIAGNOSIS — S82891Q Other fracture of right lower leg, subsequent encounter for open fracture type I or II with malunion: Secondary | ICD-10-CM

## 2015-03-31 DIAGNOSIS — R030 Elevated blood-pressure reading, without diagnosis of hypertension: Secondary | ICD-10-CM

## 2015-03-31 DIAGNOSIS — E669 Obesity, unspecified: Secondary | ICD-10-CM | POA: Diagnosis not present

## 2015-03-31 DIAGNOSIS — I10 Essential (primary) hypertension: Secondary | ICD-10-CM | POA: Insufficient documentation

## 2015-03-31 DIAGNOSIS — B3731 Acute candidiasis of vulva and vagina: Secondary | ICD-10-CM

## 2015-03-31 DIAGNOSIS — D509 Iron deficiency anemia, unspecified: Secondary | ICD-10-CM

## 2015-03-31 DIAGNOSIS — R87810 Cervical high risk human papillomavirus (HPV) DNA test positive: Secondary | ICD-10-CM | POA: Diagnosis present

## 2015-03-31 DIAGNOSIS — Z7251 High risk heterosexual behavior: Secondary | ICD-10-CM

## 2015-03-31 DIAGNOSIS — Z113 Encounter for screening for infections with a predominantly sexual mode of transmission: Secondary | ICD-10-CM | POA: Diagnosis present

## 2015-03-31 DIAGNOSIS — Z124 Encounter for screening for malignant neoplasm of cervix: Secondary | ICD-10-CM | POA: Diagnosis not present

## 2015-03-31 DIAGNOSIS — N760 Acute vaginitis: Secondary | ICD-10-CM

## 2015-03-31 DIAGNOSIS — A499 Bacterial infection, unspecified: Secondary | ICD-10-CM

## 2015-03-31 DIAGNOSIS — E559 Vitamin D deficiency, unspecified: Secondary | ICD-10-CM

## 2015-03-31 MED ORDER — FLUCONAZOLE 150 MG PO TABS: 150.0000 mg | ORAL_TABLET | Freq: Once | ORAL | Status: DC

## 2015-03-31 MED ORDER — METRONIDAZOLE 500 MG PO TABS: 500.0000 mg | ORAL_TABLET | Freq: Three times a day (TID) | ORAL | Status: DC

## 2015-03-31 MED ORDER — ERGOCALCIFEROL 1.25 MG (50000 UT) PO CAPS: 50000.0000 [IU] | ORAL_CAPSULE | ORAL | Status: DC

## 2015-03-31 MED ORDER — FERROUS SULFATE 325 (65 FE) MG PO TABS: 325.0000 mg | ORAL_TABLET | Freq: Two times a day (BID) | ORAL | Status: DC
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
## 2015-04-01 LAB — HIV ANTIBODY (ROUTINE TESTING W REFLEX): HIV: NONREACTIVE

## 2015-04-01 LAB — CYTOLOGY - PAP

## 2015-04-04 ENCOUNTER — Encounter: Payer: Self-pay | Admitting: Internal Medicine

## 2015-04-04 LAB — CERVICOVAGINAL ANCILLARY ONLY: HERPES (WINDOWPATH): NEGATIVE

## 2015-04-10 ENCOUNTER — Other Ambulatory Visit: Payer: Self-pay | Admitting: Family Medicine

## 2015-06-02 ENCOUNTER — Emergency Department (HOSPITAL_COMMUNITY)
Admission: EM | Admit: 2015-06-02 | Discharge: 2015-06-02 | Disposition: A | Discharge status: Home / Self Care | Payer: Medicaid Other | Attending: Emergency Medicine | Admitting: Emergency Medicine

## 2015-06-02 ENCOUNTER — Encounter (HOSPITAL_COMMUNITY): Payer: Self-pay

## 2015-06-02 ENCOUNTER — Emergency Department (HOSPITAL_COMMUNITY): Payer: Medicaid Other

## 2015-06-02 DIAGNOSIS — J45909 Unspecified asthma, uncomplicated: Secondary | ICD-10-CM | POA: Diagnosis not present

## 2015-06-02 DIAGNOSIS — Z7951 Long term (current) use of inhaled steroids: Secondary | ICD-10-CM | POA: Insufficient documentation

## 2015-06-02 DIAGNOSIS — Z87891 Personal history of nicotine dependence: Secondary | ICD-10-CM | POA: Insufficient documentation

## 2015-06-02 DIAGNOSIS — Z792 Long term (current) use of antibiotics: Secondary | ICD-10-CM | POA: Diagnosis not present

## 2015-06-02 DIAGNOSIS — Y990 Civilian activity done for income or pay: Secondary | ICD-10-CM | POA: Insufficient documentation

## 2015-06-02 DIAGNOSIS — S199XXA Unspecified injury of neck, initial encounter: Secondary | ICD-10-CM | POA: Diagnosis present

## 2015-06-02 DIAGNOSIS — Z8659 Personal history of other mental and behavioral disorders: Secondary | ICD-10-CM | POA: Diagnosis not present

## 2015-06-02 DIAGNOSIS — Y9289 Other specified places as the place of occurrence of the external cause: Secondary | ICD-10-CM | POA: Diagnosis not present

## 2015-06-02 DIAGNOSIS — M542 Cervicalgia: Secondary | ICD-10-CM

## 2015-06-02 DIAGNOSIS — Z79899 Other long term (current) drug therapy: Secondary | ICD-10-CM | POA: Insufficient documentation

## 2015-06-02 DIAGNOSIS — D649 Anemia, unspecified: Secondary | ICD-10-CM | POA: Insufficient documentation

## 2015-06-02 DIAGNOSIS — Y9389 Activity, other specified: Secondary | ICD-10-CM | POA: Insufficient documentation

## 2015-06-02 HISTORY — DX: Anemia, unspecified: D64.9

## 2015-06-02 MED ORDER — IBUPROFEN 600 MG PO TABS: 600.0000 mg | ORAL_TABLET | Freq: Four times a day (QID) | ORAL | Status: DC | PRN

## 2015-06-02 MED ORDER — IBUPROFEN 800 MG PO TABS
800.0000 mg | ORAL_TABLET | Freq: Once | ORAL | Status: AC
  Administered 2015-06-02: 800 mg via ORAL
  Filled 2015-06-02: qty 1
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
## 2015-06-02 NOTE — Discharge Instructions (Signed)
Ibuprofen for pain. Follow up as needed. Your xray is normal today. Return if worsening symtoms.
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
## 2015-10-03 ENCOUNTER — Ambulatory Visit: Payer: Medicaid Other | Admitting: Family Medicine

## 2015-10-30 NOTE — L&D Delivery Note (Signed)
Patient is 25 y.o. G1P0 6956w0d admitted IOL for Docia FurlcHTN    Hofmann, PendingBaby [[id number]][[id number]]  Delivery Note At  a viable female sex infant was delivered via  (Presentation: ROA  ).  APGAR: 8, 9; weight pending.   Placenta status: intact and spontaneous.  Cord: 3 vessels with the following complications: none.  Cord pH: n/a  Anesthesia: Epidural  Episiotomy:  None Lacerations:  1st degree Suture Repair: 3.0 vicryl Est. Blood Loss (mL):  100  Mom to postpartum.  Baby to Couplet care / Skin to Skin.  Almon Herculesaye T Gonfa 04/16/2016, 4:58 PM   OB fellow attestation: Patient is a G1P0 at 7256w0d who was admitted for IOL 2/2 to postdates and CHTN. Prenatal course complicated by cHTN on amlodipine.  She progressed with augmentation via cytotec x1.  I was gloved and present for delivery in its entirety.  Second stage of labor progressed no decels during second stage noted.  Complications: none  Lacerations: 1st degree, repaired  EBL: 100  Federico FlakeKimberly Niles Katianne Barre, MD 5:08 PM

## 2016-01-23 LAB — OB RESULTS CONSOLE RUBELLA ANTIBODY, IGM: Rubella: IMMUNE

## 2016-01-23 LAB — OB RESULTS CONSOLE ABO/RH: RH Type: POSITIVE

## 2016-01-23 LAB — OB RESULTS CONSOLE GC/CHLAMYDIA
Chlamydia: POSITIVE
GC PROBE AMP, GENITAL: POSITIVE

## 2016-01-23 LAB — OB RESULTS CONSOLE ANTIBODY SCREEN: ANTIBODY SCREEN: NEGATIVE

## 2016-01-23 LAB — OB RESULTS CONSOLE RPR: RPR: NONREACTIVE

## 2016-01-23 LAB — OB RESULTS CONSOLE HEPATITIS B SURFACE ANTIGEN: Hepatitis B Surface Ag: NEGATIVE

## 2016-01-23 LAB — OB RESULTS CONSOLE HIV ANTIBODY (ROUTINE TESTING): HIV: NONREACTIVE

## 2016-03-23 ENCOUNTER — Encounter (HOSPITAL_COMMUNITY): Payer: Self-pay | Admitting: Emergency Medicine

## 2016-03-23 ENCOUNTER — Inpatient Hospital Stay (HOSPITAL_COMMUNITY)
Admission: EM | Admit: 2016-03-23 | Discharge: 2016-03-23 | Disposition: A | Discharge status: Other Acute Inpatient Hospital | Payer: Medicaid Other | Source: Ambulatory Visit | Attending: Family Medicine | Admitting: Family Medicine

## 2016-03-23 DIAGNOSIS — O26893 Other specified pregnancy related conditions, third trimester: Secondary | ICD-10-CM

## 2016-03-23 DIAGNOSIS — O471 False labor at or after 37 completed weeks of gestation: Secondary | ICD-10-CM | POA: Insufficient documentation

## 2016-03-23 DIAGNOSIS — Z87891 Personal history of nicotine dependence: Secondary | ICD-10-CM | POA: Diagnosis not present

## 2016-03-23 DIAGNOSIS — O99013 Anemia complicating pregnancy, third trimester: Secondary | ICD-10-CM | POA: Insufficient documentation

## 2016-03-23 DIAGNOSIS — Z8659 Personal history of other mental and behavioral disorders: Secondary | ICD-10-CM | POA: Insufficient documentation

## 2016-03-23 DIAGNOSIS — Z79899 Other long term (current) drug therapy: Secondary | ICD-10-CM | POA: Insufficient documentation

## 2016-03-23 DIAGNOSIS — O99513 Diseases of the respiratory system complicating pregnancy, third trimester: Secondary | ICD-10-CM | POA: Diagnosis not present

## 2016-03-23 DIAGNOSIS — O9989 Other specified diseases and conditions complicating pregnancy, childbirth and the puerperium: Secondary | ICD-10-CM | POA: Insufficient documentation

## 2016-03-23 DIAGNOSIS — Z7951 Long term (current) use of inhaled steroids: Secondary | ICD-10-CM | POA: Diagnosis not present

## 2016-03-23 DIAGNOSIS — J45909 Unspecified asthma, uncomplicated: Secondary | ICD-10-CM | POA: Insufficient documentation

## 2016-03-23 DIAGNOSIS — Z792 Long term (current) use of antibiotics: Secondary | ICD-10-CM | POA: Insufficient documentation

## 2016-03-23 DIAGNOSIS — Z3A37 37 weeks gestation of pregnancy: Secondary | ICD-10-CM | POA: Insufficient documentation

## 2016-03-23 DIAGNOSIS — D649 Anemia, unspecified: Secondary | ICD-10-CM | POA: Insufficient documentation

## 2016-03-23 DIAGNOSIS — N898 Other specified noninflammatory disorders of vagina: Secondary | ICD-10-CM | POA: Insufficient documentation

## 2016-03-23 DIAGNOSIS — O479 False labor, unspecified: Secondary | ICD-10-CM

## 2016-03-23 LAB — POCT FERN TEST: POCT Fern Test: NEGATIVE

## 2016-03-23 LAB — WET PREP, GENITAL
Sperm: NONE SEEN
Trich, Wet Prep: NONE SEEN
Yeast Wet Prep HPF POC: NONE SEEN

## 2016-03-23 NOTE — MAU Provider Note (Signed)
History    CSN: [id number] Arrival date and time: 03/23/16 1628 First Provider Initiated Contact with Patient 03/23/16 1746     Chief Complaint  Patient presents with  . Contractions  . Rupture of Membranes   HPI Patient is 25 y.o. G1P0 [redacted]w[redacted]d here with complaints of leaking fluid. She reports she was upset and "squirted t little bit." She reports no continued leaking. This has not happened before. She denies vaginal discharge or odor. No dysuria or polyuria.  She was initially seen at [facility] ED and thought to be ROM and 1 cm.   +FM, denies LOF, VB, contractions, vaginal discharge.   OB History    Gravida Para Term Preterm AB TAB SAB Ectopic Multiple Living   1               Past Medical History  Diagnosis Date  . Asthma   . ADHD (attention deficit hyperactivity disorder)   . Anemia     History reviewed. No pertinent past surgical history.  History reviewed. No pertinent family history.  Social History  Substance Use Topics  . Smoking status: Former Games developer  . Smokeless tobacco: None  . Alcohol Use: No    Allergies:  Allergies  Allergen Reactions  . Bee Venom Anaphylaxis  . Citrus Swelling    Prescriptions prior to admission  Medication Sig Dispense Refill Last Dose  . albuterol (PROVENTIL HFA;VENTOLIN HFA) 108 (90 BASE) MCG/ACT inhaler Inhale 2 puffs into the lungs every 6 (six) hours as needed for wheezing or shortness of breath. 1 Inhaler 1 Taking  . amLODipine (NORVASC) 5 MG tablet Take 1 tablet (5 mg total) by mouth daily. 90 tablet 3 Taking  . EPINEPHrine (EPIPEN) 0.3 mg/0.3 mL SOAJ injection Inject 0.3 mg into the muscle as needed (for reaction).   Taking  . ergocalciferol (VITAMIN D2) 50000 UNITS capsule Take 1 capsule (50,000 Units total) by mouth once a week. 4 capsule 2   . ferrous sulfate (FERROUSUL) 325 (65 FE) MG tablet Take 1 tablet (325 mg total) by mouth 2 (two) times daily with a meal. 60 tablet 11   . fluconazole (DIFLUCAN) 150 MG tablet Take 1 tablet  (150 mg total) by mouth once. 1 tablet 0   . ibuprofen (ADVIL,MOTRIN) 600 MG tablet Take 1 tablet (600 mg total) by mouth every 6 (six) hours as needed. 30 tablet 0   . metroNIDAZOLE (FLAGYL) 500 MG tablet Take 1 tablet (500 mg total) by mouth 3 (three) times daily. 21 tablet 0   . mometasone-formoterol (DULERA) 200-5 MCG/ACT AERO Inhale 2 puffs into the lungs 2 (two) times daily. 1 Inhaler 1 Taking  . montelukast (SINGULAIR) 10 MG tablet TAKE 1 TABLET (10 MG TOTAL) BY MOUTH AT BEDTIME. 30 tablet 2   . montelukast (SINGULAIR) 10 MG tablet TAKE 1 TABLET (10 MG TOTAL) BY MOUTH AT BEDTIME. 30 tablet 2     Review of Systems  Constitutional: Negative for fever and chills.  Eyes: Negative for blurred vision and double vision.  Respiratory: Negative for cough and shortness of breath.   Cardiovascular: Negative for chest pain and orthopnea.  Gastrointestinal: Negative for nausea and vomiting.  Genitourinary: Negative for dysuria, frequency and flank pain.  Musculoskeletal: Negative for myalgias.  Skin: Negative for rash.  Neurological: Negative for dizziness, tingling, weakness and headaches.  Endo/Heme/Allergies: Does not bruise/bleed easily.  Psychiatric/Behavioral: Negative for depression and suicidal ideas. The patient is not nervous/anxious.    Physical Exam   Blood pressure 115/69, pulse  94, temperature 98.1 F (36.7 C), temperature source Oral, resp. rate 18, height 5\' 6"  (1.676 m), weight 250 lb (113.399 kg), last menstrual period 05/24/2015, SpO2 100 %.  Physical Exam  Nursing note and vitals reviewed. Constitutional: She is oriented to person, place, and time. She appears well-developed and well-nourished. No distress.  Pregnant female  HENT:  Head: Normocephalic and atraumatic.  Eyes: Conjunctivae are normal. No scleral icterus.  Neck: Normal range of motion. Neck supple.  Cardiovascular: Normal rate and intact distal pulses.   Respiratory: Effort normal. She exhibits no  tenderness.  GI: Soft. There is no tenderness. There is no rebound and no guarding.  Gravid  Genitourinary: Vagina normal.  Musculoskeletal: Normal range of motion. She exhibits no edema.  Neurological: She is alert and oriented to person, place, and time.  Skin: Skin is warm and dry. No rash noted.  Psychiatric: She has a normal mood and affect.    Dilation: Fingertip Effacement (%): Thick Cervical Position: Posterior Presentation: Undeterminable Exam by:: Janeth Rasehristina Robinson RN  MAU Course  Procedures  MDM  NST 130/mod/+ accels no decels Toco Quiet  Results for orders placed or performed during the hospital encounter of 03/23/16 (from the past 24 hour(s))  POCT fern test     Status: None   Collection Time: 03/23/16  5:36 PM  Result Value Ref Range   POCT Fern Test Negative = intact amniotic membranes   Wet prep, genital     Status: Abnormal   Collection Time: 03/23/16  6:20 PM  Result Value Ref Range   Yeast Wet Prep HPF POC NONE SEEN NONE SEEN   Trich, Wet Prep NONE SEEN NONE SEEN   Clue Cells Wet Prep HPF POC PRESENT (A) NONE SEEN   WBC, Wet Prep HPF POC MODERATE (A) NONE SEEN   Sperm NONE SEEN     Assessment and Plan  Kristin Bentley is a 25 y.o. G1P0 at 5642w4d   #Leaking fluid - Fern Neg x2, no pooling.  - Wet prep with clue cells - but not symptomatic. No need to treat at this point.   #Contractions- likely braxton hicks.  - labor precauions given, she voiced understanding  Kristin Bentley 03/23/2016, 5:46 PM

## 2016-03-23 NOTE — Discharge Instructions (Signed)
Come to the MAU (maternity admission unit) for 1) Strong contractions every 2-3 minutes for at least 2 hour that do not go away when you drink water or take a warm shower. These contractions will be so strong all you can do is breath through them 2) Vaginal bleeding- anything more than spotting 3) Loss of fluid like you broke your water 4) Decreased movement of your baby

## 2016-03-23 NOTE — ED Notes (Signed)
Patient comes in 37 weeks with possible water broken.G1  Patient states its her first child and states she felt " A gush of water". Patient states contractions are coming frequently. Fetal heart tones heard HR 149. [facility]  Patient states baby head down. Patient 1cm per EDP.

## 2016-03-23 NOTE — MAU Note (Signed)
[facility] and possible rupture of membranes. Patient states contractions started at 2pm today and she endorses feeling a gush of water. No leaking per patient since then. Denies Vaginal bleeding at this time. +FM. IV noted in Right AC upon arrival; saline locked. Report received that she was 1cm at [place] by EDP.

## 2016-03-23 NOTE — ED Provider Notes (Signed)
CSN: [id number]     Arrival date & time 03/23/16  1628 History   First MD Initiated Contact with Patient 03/23/16 1637     No chief complaint on file.    (Consider location/radiation/quality/duration/timing/severity/associated sxs/prior Treatment) HPI Comments: 25yo G1P0 at approx [redacted] weeks gestation w/ h/o asthma who p/w abdominal pain. Just prior to arrival, the patient was at home when she felt a rush of fluid from her vagina. Shortly afterwards, she began having contractions. She has continued to leak fluid. [facility]. She denies any problems during pregnancy.   The history is provided by the patient.    Past Medical History  Diagnosis Date  . Asthma   . ADHD (attention deficit hyperactivity disorder)   . Anemia    History reviewed. No pertinent past surgical history. No family history on file. Social History  Substance Use Topics  . Smoking status: Former Games developer  . Smokeless tobacco: None  . Alcohol Use: No   OB History    Gravida Para Term Preterm AB TAB SAB Ectopic Multiple Living   1              Review of Systems 10 Systems reviewed and are negative for acute change except as noted in the HPI.    Allergies  Bee venom and Citrus  Home Medications   Prior to Admission medications   Medication Sig Start Date End Date Taking? Authorizing Provider  albuterol (PROVENTIL HFA;VENTOLIN HFA) 108 (90 BASE) MCG/ACT inhaler Inhale 2 puffs into the lungs every 6 (six) hours as needed for wheezing or shortness of breath. 12/09/14   Massie Maroon, FNP  amLODipine (NORVASC) 5 MG tablet Take 1 tablet (5 mg total) by mouth daily. 03/10/15   Altha Harm, MD  EPINEPHrine (EPIPEN) 0.3 mg/0.3 mL SOAJ injection Inject 0.3 mg into the muscle as needed (for reaction).    Historical Provider, MD  ergocalciferol (VITAMIN D2) 50000 UNITS capsule Take 1 capsule (50,000 Units total) by mouth once a week. 03/31/15   Altha Harm, MD   ferrous sulfate (FERROUSUL) 325 (65 FE) MG tablet Take 1 tablet (325 mg total) by mouth 2 (two) times daily with a meal. 03/31/15   Altha Harm, MD  fluconazole (DIFLUCAN) 150 MG tablet Take 1 tablet (150 mg total) by mouth once. 03/31/15   Altha Harm, MD  ibuprofen (ADVIL,MOTRIN) 600 MG tablet Take 1 tablet (600 mg total) by mouth every 6 (six) hours as needed. 06/02/15   Tatyana Kirichenko, PA-C  metroNIDAZOLE (FLAGYL) 500 MG tablet Take 1 tablet (500 mg total) by mouth 3 (three) times daily. 03/31/15   Altha Harm, MD  mometasone-formoterol (DULERA) 200-5 MCG/ACT AERO Inhale 2 puffs into the lungs 2 (two) times daily. 12/09/14   Massie Maroon, FNP  montelukast (SINGULAIR) 10 MG tablet TAKE 1 TABLET (10 MG TOTAL) BY MOUTH AT BEDTIME. 04/01/15   Altha Harm, MD  montelukast (SINGULAIR) 10 MG tablet TAKE 1 TABLET (10 MG TOTAL) BY MOUTH AT BEDTIME. 04/11/15   Altha Harm, MD   BP 139/92 mmHg  Pulse 88  Temp(Src) 98.1 F (36.7 C) (Oral)  Resp 18  SpO2 100%  LMP 05/24/2015 (Approximate) Physical Exam  Constitutional: She is oriented to person, place, and time. She appears well-developed and well-nourished. She appears distressed.  In mild distress due to pain  HENT:  Head: Normocephalic and atraumatic.  Moist mucous membranes  Eyes: Conjunctivae are  normal. Pupils are equal, round, and reactive to light.  Neck: Neck supple.  Cardiovascular: Normal rate, regular rhythm and normal heart sounds.   No murmur heard. Pulmonary/Chest: Effort normal and breath sounds normal.  Abdominal: Soft. Bowel sounds are normal. She exhibits distension. There is no tenderness.  Gravid uterus  Genitourinary: Vaginal discharge found.  Cervix 1cm dilated/thick/high  Musculoskeletal: She exhibits no edema.  Neurological: She is alert and oriented to person, place, and time.  Fluent speech  Skin: Skin is warm and dry. No rash noted.  Psychiatric: She has a normal mood and  affect. Judgment normal.  Nursing note and vitals reviewed.  Chaperone was present during exam.  ED Course  .Critical Care Performed by: Laurence SpatesLITTLE, RACHEL MORGAN Authorized by: Laurence SpatesLITTLE, RACHEL MORGAN Total critical care time: 30 minutes Critical care time was exclusive of separately billable procedures and treating other patients. Critical care was necessary to treat or prevent imminent or life-threatening deterioration of the following conditions: active labor. Critical care was time spent personally by me on the following activities: development of treatment plan with patient or surrogate, discussions with consultants, examination of patient, obtaining history from patient or surrogate and re-evaluation of patient's condition.   (including critical care time) Labs Review Labs Reviewed - No data to display    MDM   Final diagnoses:  Active labor   Patient presents with contractions after she thinks that her water broke at home. She is roughly 37 weeks and denies any problems during pregnancy. She was immediately brought back from waiting room and placed on monitoring. Fetal heart rate in the 140s to 150. I confirmed that the baby is vertex by bedside ultrasound. [facility] Establish IV access. Sterile cervical exam shows 1cm dilation, however cervix is thick and high therefore I doubt imminent delivery. [facility] discussed with Dr. Shawnie PonsPratt, who has accepted pt in transfer.  [facility]  Laurence Spatesachel Morgan Little, MD 03/23/16 [phone]

## 2016-03-24 LAB — OB RESULTS CONSOLE GBS: STREP GROUP B AG: POSITIVE

## 2016-04-10 ENCOUNTER — Telehealth (HOSPITAL_COMMUNITY): Payer: Self-pay | Admitting: *Deleted

## 2016-04-10 ENCOUNTER — Encounter (HOSPITAL_COMMUNITY): Payer: Self-pay | Admitting: *Deleted

## 2016-04-14 ENCOUNTER — Inpatient Hospital Stay (HOSPITAL_COMMUNITY)
Admission: AD | Admit: 2016-04-14 | Discharge: 2016-04-14 | Disposition: A | Discharge status: Home / Self Care | Payer: Medicaid Other | Source: Ambulatory Visit | Attending: Obstetrics and Gynecology | Admitting: Obstetrics and Gynecology

## 2016-04-14 ENCOUNTER — Encounter (HOSPITAL_COMMUNITY): Payer: Self-pay | Admitting: *Deleted

## 2016-04-14 DIAGNOSIS — Z3493 Encounter for supervision of normal pregnancy, unspecified, third trimester: Secondary | ICD-10-CM | POA: Insufficient documentation

## 2016-04-14 HISTORY — DX: Gonococcal infection, unspecified: A54.9

## 2016-04-14 HISTORY — DX: Chlamydial infection, unspecified: A74.9

## 2016-04-14 NOTE — MAU Note (Signed)
C/O intermittent sharp crampy pain today. Feels pressure in pelvis. States she has induction scheduled for tomorrow.

## 2016-04-14 NOTE — Discharge Instructions (Signed)
Keep your scheduled appt for induction tomorrow. Return to MAU as needed.

## 2016-04-16 ENCOUNTER — Encounter (HOSPITAL_COMMUNITY): Payer: Self-pay

## 2016-04-16 ENCOUNTER — Inpatient Hospital Stay (HOSPITAL_COMMUNITY): Payer: Medicaid Other | Admitting: Anesthesiology

## 2016-04-16 ENCOUNTER — Inpatient Hospital Stay (HOSPITAL_COMMUNITY)
Admission: RE | Admit: 2016-04-16 | Discharge: 2016-04-18 | DRG: 774 | Disposition: A | Discharge status: Home / Self Care | Payer: Medicaid Other | Source: Ambulatory Visit | Attending: Obstetrics & Gynecology | Admitting: Obstetrics & Gynecology

## 2016-04-16 DIAGNOSIS — O9952 Diseases of the respiratory system complicating childbirth: Secondary | ICD-10-CM | POA: Diagnosis present

## 2016-04-16 DIAGNOSIS — E8881 Metabolic syndrome: Secondary | ICD-10-CM | POA: Diagnosis present

## 2016-04-16 DIAGNOSIS — Z6841 Body Mass Index (BMI) 40.0 and over, adult: Secondary | ICD-10-CM | POA: Diagnosis not present

## 2016-04-16 DIAGNOSIS — J45909 Unspecified asthma, uncomplicated: Secondary | ICD-10-CM | POA: Diagnosis present

## 2016-04-16 DIAGNOSIS — Z3A41 41 weeks gestation of pregnancy: Secondary | ICD-10-CM

## 2016-04-16 DIAGNOSIS — Z833 Family history of diabetes mellitus: Secondary | ICD-10-CM

## 2016-04-16 DIAGNOSIS — O99284 Endocrine, nutritional and metabolic diseases complicating childbirth: Secondary | ICD-10-CM | POA: Diagnosis present

## 2016-04-16 DIAGNOSIS — Z8249 Family history of ischemic heart disease and other diseases of the circulatory system: Secondary | ICD-10-CM

## 2016-04-16 DIAGNOSIS — Z825 Family history of asthma and other chronic lower respiratory diseases: Secondary | ICD-10-CM

## 2016-04-16 DIAGNOSIS — O99824 Streptococcus B carrier state complicating childbirth: Secondary | ICD-10-CM | POA: Diagnosis not present

## 2016-04-16 DIAGNOSIS — D649 Anemia, unspecified: Secondary | ICD-10-CM | POA: Diagnosis present

## 2016-04-16 DIAGNOSIS — A568 Sexually transmitted chlamydial infection of other sites: Secondary | ICD-10-CM | POA: Diagnosis present

## 2016-04-16 DIAGNOSIS — O99344 Other mental disorders complicating childbirth: Secondary | ICD-10-CM | POA: Diagnosis present

## 2016-04-16 DIAGNOSIS — O9902 Anemia complicating childbirth: Secondary | ICD-10-CM | POA: Diagnosis present

## 2016-04-16 DIAGNOSIS — O480 Post-term pregnancy: Secondary | ICD-10-CM | POA: Diagnosis present

## 2016-04-16 DIAGNOSIS — O9832 Other infections with a predominantly sexual mode of transmission complicating childbirth: Secondary | ICD-10-CM | POA: Diagnosis present

## 2016-04-16 DIAGNOSIS — I10 Essential (primary) hypertension: Secondary | ICD-10-CM

## 2016-04-16 DIAGNOSIS — O1002 Pre-existing essential hypertension complicating childbirth: Principal | ICD-10-CM | POA: Diagnosis present

## 2016-04-16 DIAGNOSIS — Z87891 Personal history of nicotine dependence: Secondary | ICD-10-CM

## 2016-04-16 DIAGNOSIS — F909 Attention-deficit hyperactivity disorder, unspecified type: Secondary | ICD-10-CM | POA: Diagnosis present

## 2016-04-16 DIAGNOSIS — O99214 Obesity complicating childbirth: Secondary | ICD-10-CM | POA: Diagnosis present

## 2016-04-16 DIAGNOSIS — A749 Chlamydial infection, unspecified: Secondary | ICD-10-CM

## 2016-04-16 LAB — CBC
HCT: 30.7 % — ABNORMAL LOW (ref 36.0–46.0)
HEMATOCRIT: 30 % — AB (ref 36.0–46.0)
HEMATOCRIT: 29.7 % — AB (ref 36.0–46.0)
HEMOGLOBIN: 8.9 g/dL — AB (ref 12.0–15.0)
HEMOGLOBIN: 8.5 g/dL — AB (ref 12.0–15.0)
Hemoglobin: 8.5 g/dL — ABNORMAL LOW (ref 12.0–15.0)
MCH: 17.5 pg — ABNORMAL LOW (ref 26.0–34.0)
MCH: 17.1 pg — ABNORMAL LOW (ref 26.0–34.0)
MCH: 17.3 pg — ABNORMAL LOW (ref 26.0–34.0)
MCHC: 29 g/dL — AB (ref 30.0–36.0)
MCHC: 28.3 g/dL — AB (ref 30.0–36.0)
MCHC: 28.6 g/dL — AB (ref 30.0–36.0)
MCV: 60.3 fL — ABNORMAL LOW (ref 78.0–100.0)
MCV: 60.5 fL — AB (ref 78.0–100.0)
MCV: 60.6 fL — ABNORMAL LOW (ref 78.0–100.0)
Platelets: 292 10*3/uL (ref 150–400)
Platelets: 273 10*3/uL (ref 150–400)
Platelets: 264 10*3/uL (ref 150–400)
RBC: 5.09 MIL/uL (ref 3.87–5.11)
RBC: 4.96 MIL/uL (ref 3.87–5.11)
RBC: 4.9 MIL/uL (ref 3.87–5.11)
RDW: 19.7 % — ABNORMAL HIGH (ref 11.5–15.5)
RDW: 19.9 % — AB (ref 11.5–15.5)
RDW: 19.7 % — ABNORMAL HIGH (ref 11.5–15.5)
WBC: 7.3 10*3/uL (ref 4.0–10.5)
WBC: 6 10*3/uL (ref 4.0–10.5)
WBC: 11.1 10*3/uL — AB (ref 4.0–10.5)

## 2016-04-16 LAB — TYPE AND SCREEN
ABO/RH(D): O POS
ANTIBODY SCREEN: NEGATIVE

## 2016-04-16 LAB — ABO/RH: ABO/RH(D): O POS

## 2016-04-16 LAB — RPR: RPR: NONREACTIVE

## 2016-04-16 MED ORDER — LACTATED RINGERS IV SOLN
INTRAVENOUS | Status: DC
  Administered 2016-04-16 (×2): via INTRAVENOUS

## 2016-04-16 MED ORDER — SENNOSIDES-DOCUSATE SODIUM 8.6-50 MG PO TABS
2.0000 | ORAL_TABLET | ORAL | Status: DC
  Administered 2016-04-16 – 2016-04-17 (×2): 2 via ORAL
  Filled 2016-04-16 (×2): qty 2

## 2016-04-16 MED ORDER — DEXTROSE 5 % IV SOLN
2.5000 10*6.[IU] | INTRAVENOUS | Status: DC
  Administered 2016-04-16: 2.5 10*6.[IU] via INTRAVENOUS
  Filled 2016-04-16 (×4): qty 2.5

## 2016-04-16 MED ORDER — LACTATED RINGERS IV SOLN
500.0000 mL | Freq: Once | INTRAVENOUS | Status: AC
  Administered 2016-04-16: 500 mL via INTRAVENOUS

## 2016-04-16 MED ORDER — EPINEPHRINE 0.3 MG/0.3ML IJ SOAJ
0.3000 mg | Freq: Once | INTRAMUSCULAR | Status: DC | PRN
  Filled 2016-04-16: qty 0.3

## 2016-04-16 MED ORDER — ZOLPIDEM TARTRATE 5 MG PO TABS: 5.0000 mg | ORAL_TABLET | Freq: Every evening | ORAL | Status: DC | PRN

## 2016-04-16 MED ORDER — WITCH HAZEL-GLYCERIN EX PADS: 1.0000 "application " | MEDICATED_PAD | CUTANEOUS | Status: DC | PRN

## 2016-04-16 MED ORDER — TETANUS-DIPHTH-ACELL PERTUSSIS 5-2.5-18.5 LF-MCG/0.5 IM SUSP
0.5000 mL | Freq: Once | INTRAMUSCULAR | Status: DC
Start: 2016-04-17 — End: 2016-04-18

## 2016-04-16 MED ORDER — ONDANSETRON HCL 4 MG/2ML IJ SOLN: 4.0000 mg | Freq: Four times a day (QID) | INTRAMUSCULAR | Status: DC | PRN

## 2016-04-16 MED ORDER — ACETAMINOPHEN 325 MG PO TABS
650.0000 mg | ORAL_TABLET | ORAL | Status: DC | PRN
  Administered 2016-04-17 (×2): 650 mg via ORAL
  Filled 2016-04-16 (×2): qty 2

## 2016-04-16 MED ORDER — FENTANYL CITRATE (PF) 100 MCG/2ML IJ SOLN
100.0000 ug | INTRAMUSCULAR | Status: DC | PRN
  Administered 2016-04-16 (×2): 100 ug via INTRAVENOUS
  Filled 2016-04-16 (×2): qty 2

## 2016-04-16 MED ORDER — ONDANSETRON HCL 4 MG/2ML IJ SOLN: 4.0000 mg | INTRAMUSCULAR | Status: DC | PRN

## 2016-04-16 MED ORDER — MISOPROSTOL 25 MCG QUARTER TABLET
25.0000 ug | ORAL_TABLET | ORAL | Status: DC | PRN
  Administered 2016-04-16: 25 ug via VAGINAL
  Filled 2016-04-16: qty 1
  Filled 2016-04-16 (×2): qty 0.25

## 2016-04-16 MED ORDER — ONDANSETRON HCL 4 MG PO TABS: 4.0000 mg | ORAL_TABLET | ORAL | Status: DC | PRN

## 2016-04-16 MED ORDER — OXYTOCIN BOLUS FROM INFUSION
500.0000 mL | INTRAVENOUS | Status: DC
  Administered 2016-04-16: 500 mL via INTRAVENOUS

## 2016-04-16 MED ORDER — LACTATED RINGERS IV SOLN: 500.0000 mL | Freq: Once | INTRAVENOUS | Status: AC

## 2016-04-16 MED ORDER — LIDOCAINE HCL (PF) 1 % IJ SOLN
30.0000 mL | INTRAMUSCULAR | Status: DC | PRN
  Filled 2016-04-16: qty 30

## 2016-04-16 MED ORDER — PENICILLIN G POTASSIUM 5000000 UNITS IJ SOLR
5.0000 10*6.[IU] | Freq: Once | INTRAVENOUS | Status: AC
  Filled 2016-04-16: qty 5

## 2016-04-16 MED ORDER — PRENATAL MULTIVITAMIN CH
1.0000 | ORAL_TABLET | Freq: Every day | ORAL | Status: DC
  Administered 2016-04-17 – 2016-04-18 (×2): 1 via ORAL
  Filled 2016-04-16 (×2): qty 1

## 2016-04-16 MED ORDER — ALBUTEROL SULFATE (2.5 MG/3ML) 0.083% IN NEBU: 3.0000 mL | INHALATION_SOLUTION | Freq: Four times a day (QID) | RESPIRATORY_TRACT | Status: DC | PRN

## 2016-04-16 MED ORDER — OXYCODONE-ACETAMINOPHEN 5-325 MG PO TABS: 1.0000 | ORAL_TABLET | ORAL | Status: DC | PRN

## 2016-04-16 MED ORDER — DIBUCAINE 1 % RE OINT: 1.0000 "application " | TOPICAL_OINTMENT | RECTAL | Status: DC | PRN

## 2016-04-16 MED ORDER — EPHEDRINE 5 MG/ML INJ
10.0000 mg | INTRAVENOUS | Status: DC | PRN
  Filled 2016-04-16: qty 2

## 2016-04-16 MED ORDER — OXYCODONE-ACETAMINOPHEN 5-325 MG PO TABS: 2.0000 | ORAL_TABLET | ORAL | Status: DC | PRN

## 2016-04-16 MED ORDER — DIPHENHYDRAMINE HCL 25 MG PO CAPS: 25.0000 mg | ORAL_CAPSULE | Freq: Four times a day (QID) | ORAL | Status: DC | PRN

## 2016-04-16 MED ORDER — FLEET ENEMA 7-19 GM/118ML RE ENEM: 1.0000 | ENEMA | RECTAL | Status: DC | PRN

## 2016-04-16 MED ORDER — DIPHENHYDRAMINE HCL 50 MG/ML IJ SOLN: 12.5000 mg | INTRAMUSCULAR | Status: DC | PRN

## 2016-04-16 MED ORDER — IBUPROFEN 600 MG PO TABS
600.0000 mg | ORAL_TABLET | Freq: Four times a day (QID) | ORAL | Status: DC
  Administered 2016-04-16 – 2016-04-18 (×7): 600 mg via ORAL
  Filled 2016-04-16 (×7): qty 1

## 2016-04-16 MED ORDER — AMLODIPINE BESYLATE 5 MG PO TABS
5.0000 mg | ORAL_TABLET | Freq: Every day | ORAL | Status: DC
  Administered 2016-04-16 – 2016-04-18 (×3): 5 mg via ORAL
  Filled 2016-04-16 (×5): qty 1

## 2016-04-16 MED ORDER — PHENYLEPHRINE 40 MCG/ML (10ML) SYRINGE FOR IV PUSH (FOR BLOOD PRESSURE SUPPORT)
80.0000 ug | PREFILLED_SYRINGE | INTRAVENOUS | Status: DC | PRN
  Filled 2016-04-16: qty 5
  Filled 2016-04-16: qty 10

## 2016-04-16 MED ORDER — OXYTOCIN 40 UNITS IN LACTATED RINGERS INFUSION - SIMPLE MED: 1.0000 m[IU]/min | INTRAVENOUS | Status: DC

## 2016-04-16 MED ORDER — COCONUT OIL OIL: 1.0000 "application " | TOPICAL_OIL | Status: DC | PRN

## 2016-04-16 MED ORDER — SOD CITRATE-CITRIC ACID 500-334 MG/5ML PO SOLN: 30.0000 mL | ORAL | Status: DC | PRN

## 2016-04-16 MED ORDER — TERBUTALINE SULFATE 1 MG/ML IJ SOLN
0.2500 mg | Freq: Once | INTRAMUSCULAR | Status: DC | PRN
  Filled 2016-04-16: qty 1

## 2016-04-16 MED ORDER — SIMETHICONE 80 MG PO CHEW: 80.0000 mg | CHEWABLE_TABLET | ORAL | Status: DC | PRN

## 2016-04-16 MED ORDER — OXYTOCIN 40 UNITS IN LACTATED RINGERS INFUSION - SIMPLE MED
2.5000 [IU]/h | INTRAVENOUS | Status: DC
  Filled 2016-04-16: qty 1000

## 2016-04-16 MED ORDER — PENICILLIN G POTASSIUM 5000000 UNITS IJ SOLR
5.0000 10*6.[IU] | Freq: Once | INTRAVENOUS | Status: DC
  Administered 2016-04-16: 5 10*6.[IU] via INTRAVENOUS
  Filled 2016-04-16: qty 5

## 2016-04-16 MED ORDER — BENZOCAINE-MENTHOL 20-0.5 % EX AERO
1.0000 "application " | INHALATION_SPRAY | CUTANEOUS | Status: DC | PRN
  Administered 2016-04-17: 1 via TOPICAL
  Filled 2016-04-16: qty 56

## 2016-04-16 MED ORDER — ACETAMINOPHEN 325 MG PO TABS: 650.0000 mg | ORAL_TABLET | ORAL | Status: DC | PRN

## 2016-04-16 MED ORDER — LACTATED RINGERS IV SOLN: 500.0000 mL | INTRAVENOUS | Status: DC | PRN

## 2016-04-16 MED ORDER — PENICILLIN G POTASSIUM 5000000 UNITS IJ SOLR
2.5000 10*6.[IU] | INTRAVENOUS | Status: DC
  Filled 2016-04-16 (×3): qty 2.5

## 2016-04-16 MED ORDER — PHENYLEPHRINE 40 MCG/ML (10ML) SYRINGE FOR IV PUSH (FOR BLOOD PRESSURE SUPPORT)
80.0000 ug | PREFILLED_SYRINGE | INTRAVENOUS | Status: DC | PRN
  Filled 2016-04-16: qty 10
  Filled 2016-04-16: qty 5

## 2016-04-16 MED ORDER — EPINEPHRINE HCL 1 MG/ML IJ SOLN
0.3000 mg | Freq: Once | INTRAMUSCULAR | Status: DC | PRN
  Filled 2016-04-16: qty 1

## 2016-04-16 MED ORDER — FENTANYL 2.5 MCG/ML BUPIVACAINE 1/10 % EPIDURAL INFUSION (WH - ANES)
14.0000 mL/h | INTRAMUSCULAR | Status: DC | PRN
  Administered 2016-04-16: 14 mL/h via EPIDURAL
  Filled 2016-04-16: qty 125

## 2016-04-16 MED ORDER — LIDOCAINE HCL (PF) 1 % IJ SOLN
INTRAMUSCULAR | Status: DC | PRN
  Administered 2016-04-16 (×2): 6 mL

## 2016-04-16 NOTE — Progress Notes (Signed)
Pt instructed she may begin pushing with ctxs. 

## 2016-04-16 NOTE — Anesthesia Procedure Notes (Signed)
Epidural Patient location during procedure: OB  Staffing Anesthesiologist: Sherrian DiversENENNY, Jaisha Villacres  Preanesthetic Checklist Completed: patient identified, site marked, surgical consent, pre-op evaluation, timeout performed, IV checked, risks and benefits discussed and monitors and equipment checked  Epidural Prep: DuraPrep Patient monitoring: heart rate and blood pressure Approach: midline Location: L4-L5 Injection technique: LOR saline  Needle:  Needle type: Tuohy  Needle gauge: 17 G Needle length: 9 cm Needle insertion depth: 8 cm Catheter type: closed end flexible Catheter size: 19 Gauge Catheter at skin depth: 15 cm Test dose: negative and Other  Assessment Events: blood not aspirated, injection not painful, no injection resistance, negative IV test and no paresthesia  Additional Notes Reason for block:procedure for pain

## 2016-04-16 NOTE — Progress Notes (Signed)
Labor Progress Note Kristin SharpsKhadijah Meissner is a 25 y.o. G1P0 at 3454w0d presented for IOL for cHTN  S:  Comfortable with epidural, no c/o.   O:  BP 114/69 mmHg  Pulse 80  Temp(Src) 97.4 F (36.3 C) (Oral)  Resp 18  SpO2 100%  LMP 05/24/2015 (Approximate) EFM: baseline 140 bpm/mod variability/no accels/ variable decels  Toco: 2-3 SVE: 4/90/-1  A/P: 25 y.o. G1P0 8054w0d  1. Labor: early active 2. FWB: Cat II 3. Pain: epidural 4. GBS positive Variables likely d/t cord compression, maternal reposition, peanut ball. Anticipate labor progression and SVD.  [name], CNM 11:18 AM

## 2016-04-16 NOTE — H&P (Signed)
LABOR AND DELIVERY ADMISSION HISTORY AND PHYSICAL NOTE  Kristin SharpsKhadijah Bentley is a 25 y.o. female G1P0 with IUP at 6352w0d by LMP presenting for IOL for cHTN.   Prenatal Risk factors: Morbid obesity, asthma, no PNC in 3rd trimester (last OB visit in 01/2016), Anemia   She reports positive fetal movement. She denies headache, changes in vision, RUQ pain, swelling extremities, CP, SOB, leakage of fluid or vaginal bleeding.  Prenatal History/Complications:  Past Medical History: Past Medical History  Diagnosis Date  . Asthma   . ADHD (attention deficit hyperactivity disorder)   . Anemia   . Gonorrhea   . Chlamydia     Past Surgical History: Past Surgical History  Procedure Laterality Date  . Wisdom tooth extraction      Obstetrical History: OB History    Gravida Para Term Preterm AB TAB SAB Ectopic Multiple Living   1               Social History: Social History   Social History  . Marital Status: Single    Spouse Name: N/A  . Number of Children: N/A  . Years of Education: N/A   Social History Main Topics  . Smoking status: Former Games developermoker  . Smokeless tobacco: None  . Alcohol Use: No  . Drug Use: No  . Sexual Activity: Yes    Birth Control/ Protection: None   Other Topics Concern  . None   Social History Narrative    Family History: Family History  Problem Relation Age of Onset  . Heart disease Mother   . Diabetes Mother   . Hypertension Mother   . Asthma Sister   . Diabetes Maternal Grandmother     Allergies: Allergies  Allergen Reactions  . Bee Venom Anaphylaxis  . Citrus Anaphylaxis    Prescriptions prior to admission  Medication Sig Dispense Refill Last Dose  . albuterol (PROVENTIL HFA;VENTOLIN HFA) 108 (90 BASE) MCG/ACT inhaler Inhale 2 puffs into the lungs every 6 (six) hours as needed for wheezing or shortness of breath. 1 Inhaler 1 Past Month at Unknown time  . amLODipine (NORVASC) 5 MG tablet Take 1 tablet (5 mg total) by mouth daily. 90  tablet 3 04/14/2016 at Unknown time  . EPINEPHrine (EPIPEN 2-PAK) 0.3 mg/0.3 mL IJ SOAJ injection Inject 0.3 mg into the muscle once as needed (for severe allergic reaction).   PRN at PRN  . ferrous sulfate 325 (65 FE) MG tablet Take 325 mg by mouth 2 (two) times daily with a meal.   04/14/2016 at Unknown time  . montelukast (SINGULAIR) 10 MG tablet Take 10 mg by mouth at bedtime.   04/13/2016 at Unknown time  . Prenatal Vit-Fe Fumarate-FA (PRENATAL MULTIVITAMIN) TABS tablet Take 1 tablet by mouth daily.    04/14/2016 at Unknown time     Review of Systems   All systems reviewed and negative except as stated in HPI  Blood pressure 126/81, pulse 102, temperature 97.9 F (36.6 C), temperature source Oral, resp. rate 18, last menstrual period 05/24/2015. General appearance: alert and cooperative Lungs: clear to auscultation bilaterally Heart: regular rate and rhythm Abdomen: soft, non-tender; bowel sounds normal Extremities: No calf swelling or tenderness Presentation: cephalic Fetal monitoring: Baseline 145, mod variability, +acels, no decels-reactive Uterine activity: None Dilation: 1 Effacement (%): 50 Station: -3 Exam by:: CGoodnight,RN    Prenatal labs: ABO, Rh: O/Positive/-- (03/27 0000) Antibody: Negative (03/27 0000) Rubella: Immune RPR: Nonreactive (03/27 0000)  HBsAg: Negative (03/27 0000)  HIV: Non-reactive (03/27 0000)  GBS: Positive (05/27 0000)  1 hr Glucola: Results unavailable Genetic screening:  Normal Anatomy US: Not completed   Prenatal Transfer Tool  Maternal Diabetes: No Genetic Screening: Normal Maternal Ultrasounds/Referrals: Normal Fetal Ultrasounds or other Referrals:  None Maternal Substance Abuse:  No Significant Maternal Medications:  None Significant Maternal Lab Results: Lab values include: Group B Strep positive, Other: Chlamydia/Gonorhea on 12/2014 without test of cure  No results found for this or any previous visit (from the past 24  hour(s)).  Patient Active Problem List   Diagnosis Date Noted  . HTN (hypertension) 04/16/2016  . Essential hypertension 03/31/2015  . Obesity 12/14/2014  . Asthma, chronic 12/14/2014  . Metabolic syndrome 12/14/2014  . History of attention deficit hyperactivity disorder (ADHD) 12/14/2014  . Asthma exacerbation 09/23/2014  . Acute bronchitis 09/23/2014    Assessment: Kristin Bentley is a 25 y.o. G1P0 at [redacted]w[redacted]d here for IOL for cHTN  #Labor:Early, start with cytotec #Pain: Fentanyl, NO #FWB: Cat-I #ID:  GBS positive-PCN, Chlamydia/Gonorrhea-test for cure pending #MOF: Breast #MOC:Undecided #Circ:  Not desired  Kristin Bentley 04/16/2016, 2:04 AM

## 2016-04-16 NOTE — Anesthesia Pain Management Evaluation Note (Signed)
  CRNA Pain Management Visit Note  Patient: Kristin SharpsKhadijah Burgio, 25 y.o., female  "Hello [facility]. We have an anesthesia team available at all times to provide care throughout the hospital, including epidural management and anesthesia for C-section. I don't know your plan for the delivery whether it a natural birth, water birth, IV sedation, nitrous supplementation, doula or epidural, but we want to meet your pain goals."   1.Was your pain managed to your expectations on prior hospitalizations?   No prior hospitalizations  2.What is your expectation for pain management during this hospitalization?     Epidural  3.How can we help you reach that goal? Patient preparing for epidural placement.  L&D RN in room to administer IV pain medication until anesthesiologist available for epidural placement.  Record the patient's initial score and the patient's pain goal.   Pain: 10  Pain Goal: Patient with contractions and unable to give a pain goal during the anesthesia interview.  [facility] wants you to be able to say your pain was always managed very well.  Shantia Sanford L 04/16/2016

## 2016-04-16 NOTE — Anesthesia Preprocedure Evaluation (Addendum)
Anesthesia Evaluation  Patient identified by MRN, date of birth, ID band Patient awake  General Assessment Comment:Patient Active Problem List  Diagnosis Date Noted . HTN (hypertension) 04/16/2016 . Essential hypertension 03/31/2015 . Obesity 12/14/2014 . Asthma, chronic 12/14/2014 . Metabolic syndrome 12/14/2014 . History of attention deficit hyperactivity disorder (ADHD) 12/14/2014 . Asthma exacerbation 09/23/2014 . Acute bronchitis 09/23/2014      Reviewed: Allergy & Precautions, NPO status , Patient's Chart, lab work & pertinent test results  Airway Mallampati: II  TM Distance: >3 FB Neck ROM: Full    Dental no notable dental hx.    Pulmonary asthma , former smoker,    Pulmonary exam normal breath sounds clear to auscultation       Cardiovascular hypertension, Normal cardiovascular exam Rhythm:Regular Rate:Normal     Neuro/Psych PSYCHIATRIC DISORDERS negative neurological ROS     GI/Hepatic negative GI ROS, Neg liver ROS,   Endo/Other  Morbid obesity  Renal/GU negative Renal ROS  negative genitourinary   Musculoskeletal negative musculoskeletal ROS (+)   Abdominal (+) + obese,   Peds negative pediatric ROS (+)  Hematology  (+) anemia ,   Anesthesia Other Findings   Reproductive/Obstetrics negative OB ROS                            Anesthesia Physical Anesthesia Plan  ASA: III  Anesthesia Plan: Epidural   Post-op Pain Management:    Induction: Intravenous  Airway Management Planned: Natural Airway  Additional Equipment:   Intra-op Plan:   Post-operative Plan:   Informed Consent: I have reviewed the patients History and Physical, chart, labs and discussed the procedure including the risks, benefits and alternatives for the proposed anesthesia with the patient or authorized representative who has indicated his/her understanding and  acceptance.   Dental advisory given  Plan Discussed with: CRNA  Anesthesia Plan Comments:         Anesthesia Quick Evaluation

## 2016-04-16 NOTE — Progress Notes (Signed)
Labor Progress Note Kristin Bentley is a 25 y.o. G1P0 at 8675w0d presented for IOL for cHTN  S:  Comfortable with epidural, no c/o.  O:  BP 131/83 mmHg  Pulse 71  Temp(Src) 98.1 F (36.7 C) (Oral)  Resp 18  Ht 5\' 6"  (1.676 m)  Wt 253 lb (114.76 kg)  BMI 40.85 kg/m2  SpO2 100%  LMP 05/24/2015 (Approximate) EFM: baseline 130 bpm/mod variability/+ accels/ no decels  Toco: 3-4 SVE: 10/100/+1; AROM-clear, mod  A/P: [age] y.o. G1P0 4775w0d  1. Labor: second stage 2. FWB: Cat I 3. Pain: epidural 4. GBS positive Will labor down, start pushing with pressure. Anticipate SVD.  Kristin Bentley, CNM 3:11 PM

## 2016-04-16 NOTE — Anesthesia Pain Management Evaluation Note (Signed)
  CRNA Pain Management Visit Note  Patient: Kristin Bentley, 25 y.o., female  "[facility]. We have an anesthesia team available at all times to provide care throughout the hospital, including epidural management and anesthesia for C-section. I don't know your plan for the delivery whether it a natural birth, water birth, IV sedation, nitrous supplementation, doula or epidural, but we want to meet your pain goals."   1.Was your pain managed to your expectations on prior hospitalizations?   No prior hospitalizations  2.What is your expectation for pain management during this hospitalization?     Epidural  3.How can we help you reach that goal? Epidural.  Record the patient's initial score and the patient's pain goal.   Pain: 0  Pain Goal: Patient did not give a pain goal, but is satisfied with her care.  [facility] wants you to be able to say your pain was always managed very well.  [name] 04/16/2016

## 2016-04-17 LAB — CBC
HCT: 28 % — ABNORMAL LOW (ref 36.0–46.0)
HEMOGLOBIN: 8.1 g/dL — AB (ref 12.0–15.0)
MCH: 17.5 pg — AB (ref 26.0–34.0)
MCHC: 28.9 g/dL — ABNORMAL LOW (ref 30.0–36.0)
MCV: 60.5 fL — ABNORMAL LOW (ref 78.0–100.0)
Platelets: 252 10*3/uL (ref 150–400)
RBC: 4.63 MIL/uL (ref 3.87–5.11)
RDW: 19.8 % — ABNORMAL HIGH (ref 11.5–15.5)
WBC: 10.5 10*3/uL (ref 4.0–10.5)

## 2016-04-17 LAB — GC/CHLAMYDIA PROBE AMP (~~LOC~~) NOT AT ARMC
CHLAMYDIA, DNA PROBE: POSITIVE — AB
NEISSERIA GONORRHEA: NEGATIVE

## 2016-04-17 MED ORDER — AZITHROMYCIN 500 MG PO TABS
1000.0000 mg | ORAL_TABLET | Freq: Once | ORAL | Status: AC
  Administered 2016-04-17: 1000 mg via ORAL
  Filled 2016-04-17: qty 2

## 2016-04-17 NOTE — Lactation Note (Signed)
This note was copied from a baby's chart. Lactation Consultation Note New mom BF baby in cross cradle position when entered rm. Mom states baby is biting. Baby had a good latch, baby is clamping hard. Repositioned to football. Assisted in latch. Baby aggressive. Taught chin tug, cheek to breast, proper holding of breast. Breast soft. Everted nipple w/pendulum breast pointing outwards. Moms plan when she gets home is to pump and bottle feed. [facility] doesn't have a [facility], [facility] Mom shown how to use DEBP & how to disassemble, clean, & reassemble parts.Mom knows to pump q3h for 15-20 min. Mom encouraged to feed baby 8-12 times/24 hours and with feeding cues. Hand expression taught to Mom.  Educated about newborn behavior, STS, I&O, supply and demand. Referred to Baby and Me Book in Breastfeeding section Pg. 22-23 for position options and Proper latch demonstration. WH/LC brochure given w/resources, support groups and LC services. Patient Name: Kristin Phoebe SharpsKhadijah Bentley UXLKG'MToday's Date: 04/17/2016 Reason for consult: Initial assessment   Maternal Data Has patient been taught Hand Expression?: Yes Does the patient have breastfeeding experience prior to this delivery?: No  Feeding Feeding Type: Breast Fed Length of feed: 10 min  LATCH Score/Interventions Latch: Repeated attempts needed to sustain latch, nipple held in mouth throughout feeding, stimulation needed to elicit sucking reflex. Intervention(s): Adjust position;Assist with latch;Breast massage;Breast compression  Audible Swallowing: None Intervention(s): Hand expression;Skin to skin Intervention(s): Alternate breast massage  Type of Nipple: Everted at rest and after stimulation  Comfort (Breast/Nipple): Soft / non-tender     Hold (Positioning): Assistance needed to correctly position infant at breast and maintain latch. Intervention(s): Skin to skin;Position options;Support Pillows;Breastfeeding basics reviewed  LATCH  Score: 6  Lactation Tools Discussed/Used Tools: Pump Breast pump type: Double-Electric Breast Pump WIC Program: Yes Pump Review: Setup, frequency, and cleaning;Milk Storage Initiated by:: Peri JeffersonL. Jakyla Reza RN Date initiated:: 04/17/16   Consult Status Consult Status: Follow-up Date: 04/18/16 Follow-up type: In-patient    Prateek Knipple, Diamond NickelLAURA G 04/17/2016, 6:15 AM

## 2016-04-17 NOTE — Progress Notes (Signed)
Post Partum Day 1 Subjective:  Kristin Bentley is a 25 y.o. G1P1001 3626w0d s/p SVD after IOL for cHTN.  No acute events overnight.  Pt started ambulation with nursing assistance. Denies problems with voiding or po intake.  She denies nausea or vomiting.  Pain is well controlled. Lochia Minimal.  Plan for birth control is nexplanon.  Method of Feeding: breast  Objective: Blood pressure 123/74, pulse 81, temperature 97.9 F (36.6 C), temperature source Oral, resp. rate 18, height 5\' 6"  (1.676 m), weight 253 lb (114.76 kg), last menstrual period 05/24/2015, SpO2 98 %, unknown if currently breastfeeding.  Physical Exam:  General: alert, cooperative and no distress Lochia:normal flow Chest: normal WOB Heart: Regular rate Abdomen: +BS, soft, mild TTP (appropriate) Uterine Fundus: firm DVT Evaluation: No evidence of DVT seen on physical exam. Extremities: no edema   Recent Labs  04/16/16 1829 04/17/16 0514  HGB 8.5* 8.1*  HCT 29.7* 28.0*    Assessment/Plan:  ASSESSMENT: Kristin Bentley is a 25 y.o. G1P1001 7326w0d s/p SVD after IOL for cHTN. BP within normal range  CHTN: BP within normal range. Continue Norvac 5 mg daily  Plan for discharge tomorrow Continue routine PP care Breastfeeding support PRN  LOS: 1 day   Taye T Gonfa 04/17/2016, 9:10 AM

## 2016-04-17 NOTE — Anesthesia Postprocedure Evaluation (Signed)
Anesthesia Post Note  Patient: Kristin Bentley  Procedure(s) Performed: * No procedures listed *  Patient location during evaluation: Mother Baby Anesthesia Type: Epidural Level of consciousness: awake Pain management: pain level controlled Vital Signs Assessment: post-procedure vital signs reviewed and stable Respiratory status: spontaneous breathing Cardiovascular status: stable Postop Assessment: no headache, no backache, epidural receding and patient able to bend at knees Anesthetic complications: no     Last Vitals:  Filed Vitals:   04/17/16 0101 04/17/16 0525  BP: 115/67 123/74  Pulse: 76 81  Temp: 36.7 C 36.6 C  Resp: 16 18    Last Pain:  Filed Vitals:   04/17/16 0930  PainSc: 3    Pain Goal:                 Edison PaceWILKERSON,Durand Wittmeyer

## 2016-04-18 DIAGNOSIS — A749 Chlamydial infection, unspecified: Secondary | ICD-10-CM

## 2016-04-18 MED ORDER — IBUPROFEN 600 MG PO TABS: 600.0000 mg | ORAL_TABLET | Freq: Four times a day (QID) | ORAL | Status: DC

## 2016-04-18 MED ORDER — ACETAMINOPHEN 325 MG PO TABS: 650.0000 mg | ORAL_TABLET | ORAL | Status: DC | PRN

## 2016-04-18 NOTE — Discharge Instructions (Signed)

## 2016-04-18 NOTE — Discharge Summary (Signed)
OB Discharge Summary     Patient Name: Kristin Bentley DOB: 1990/11/26 MRN: [id number]  Date of admission: 04/16/2016 Delivering MD:    Jahnaya, Branscome [[id number]]  SYRETTA, KOCHEL, Boy Jevaeh [[id number]]  Candelaria Stagers T    Date of discharge: 04/18/2016  Admitting diagnosis: INDUCTION Intrauterine pregnancy: [redacted]w[redacted]d     Secondary diagnosis:  Active Problems:   Asthma, chronic   HTN (hypertension)   NSVD (normal spontaneous vaginal delivery)   Chlamydia  Additional problems: none     Discharge diagnosis: Term Pregnancy Delivered                                                                                                Post partum procedures:none  Augmentation: AROM, Pitocin, Cytotec and Foley Balloon  Complications: None  Hospital course:  Induction of Labor With Vaginal Delivery   25 y.o. yo G1P1001 at [redacted]w[redacted]d was admitted to the hospital 04/16/2016 for induction of labor.  Indication for induction: chronic hypertension.  Patient had an uncomplicated labor course as follows: Membrane Rupture Time/Date:    Blossom, Crume [[id number]]  3:00 PM   Emberleigh, Reily [[id number]]  3:00 PM ,   Mahealani, Sulak [[id number]]  04/16/2016   Shahla, Betsill [[id number]]  04/16/2016   Intrapartum Procedures: Episiotomy:    Justeen, Hehr [[id number]]      Kellianne, Ek Boy [place] [[id number]]  None [1]                                         Lacerations:     Hesper, Venturella [[id number]]      Joshlyn, Beadle Boy Mazie [[id number]]  1st degree [2]  Patient had delivery of a Viable infant.  Information for the patient's newborn:  Naketa, Daddario [[id number]]    Information for the patient's newborn:  Lakeita, Panther [[id number]]  Delivery Method: Vaginal, Spontaneous Delivery (Filed from Delivery Summary)     Evian, Derringer [[id number]]    Joeli, Fenner Cheryln  [[id number]]  04/16/2016  Details of delivery can be found in separate delivery note.  Patient had a routine postpartum course. Patient is discharged home 04/18/2016.  Chronic hypertension: patient had normal range blood pressures throughout her hospitalization. Discharged home on home Amlodipine 5 mg daily. Baby love ordered at time of discharge.  Chlamydia: treated for this in the past. TOC was negative recently but positive again this admission. Treated with Azithromycin 1 gm and CTX 250 mg once. Gave paper Rx for Azithromycin 1 gm for partner on discharge.   Anemia: Hgb 8.1 on discharge (8.9 on admission). Not symptomatic. Discharged on FeSO4 and prenatal vit  Asthma: stable. Continued and discharged on home meds.  Physical exam  Filed Vitals:   04/17/16 0525 04/17/16 1847 04/18/16 0538 04/18/16 1045  BP: 123/74 120/72 124/72 120/69  Pulse: 81 77 69   Temp: 97.9 F (36.6 C) 98.1 F (36.7 C) 98 F (36.7 C)   TempSrc: Oral Oral Oral   Resp: 18 18 18  Height:      Weight:      SpO2:       General: alert, cooperative and no distress Lochia: appropriate Uterine Fundus: firm Incision: N/A DVT Evaluation: No evidence of DVT seen on physical exam. Labs: Lab Results  Component Value Date   WBC 10.5 04/17/2016   HGB 8.1* 04/17/2016   HCT 28.0* 04/17/2016   MCV 60.5* 04/17/2016   PLT 252 04/17/2016   CMP Latest Ref Rng 12/09/2014  Glucose 70 - 99 mg/dL 84  BUN 6 - 23 mg/dL 10  Creatinine 1.610.50 - 0.961.10 mg/dL 0.450.60  Sodium 409135 - 811145 mEq/L 135  Potassium 3.5 - 5.3 mEq/L 3.9  Chloride 96 - 112 mEq/L 99  CO2 19 - 32 mEq/L 25  Calcium 8.4 - 10.5 mg/dL 9.5  Total Protein 6.0 - 8.3 g/dL 7.3  Total Bilirubin 0.2 - 1.2 mg/dL 0.3  Alkaline Phos 39 - 117 U/L 98  AST 0 - 37 U/L 16  ALT 0 - 35 U/L 15    Discharge instruction: per After Visit Summary and "Baby and Me Booklet".  After visit meds:    Medication List    TAKE these medications        acetaminophen 325 MG tablet   Commonly known as:  TYLENOL  Take 2 tablets (650 mg total) by mouth every 4 (four) hours as needed (for pain scale < 4).     albuterol 108 (90 Base) MCG/ACT inhaler  Commonly known as:  PROVENTIL HFA;VENTOLIN HFA  Inhale 2 puffs into the lungs every 6 (six) hours as needed for wheezing or shortness of breath.     amLODipine 5 MG tablet  Commonly known as:  NORVASC  Take 1 tablet (5 mg total) by mouth daily.     EPIPEN 2-PAK 0.3 mg/0.3 mL Soaj injection  Generic drug:  EPINEPHrine  Inject 0.3 mg into the muscle once as needed (for severe allergic reaction).     ferrous sulfate 325 (65 FE) MG tablet  Take 325 mg by mouth 2 (two) times daily with a meal.     ibuprofen 600 MG tablet  Commonly known as:  ADVIL,MOTRIN  Take 1 tablet (600 mg total) by mouth every 6 (six) hours.     montelukast 10 MG tablet  Commonly known as:  SINGULAIR  Take 10 mg by mouth at bedtime.     prenatal multivitamin Tabs tablet  Take 1 tablet by mouth daily.        Diet: routine diet  Activity: Advance as tolerated. Pelvic rest for 6 weeks.   Outpatient follow up:2 weeks Follow-up Information    [facility] In 2 weeks.   Why:  Chronic hypertension in postpartum   Contact information:   [address] [place] [address] [phone]      Postpartum contraception: Undecided  Newborn Data:   Arby BarretteCarmichael, PendingBaby [[id number]][[id number]]  Live born unspecified sex  Birth Weight:   APGAR: ,    Normajean GlasgowCarmichael, Boy Maudine [[id number]][[id number]]  Live born female  Birth Weight: 7 lb 2.5 oz (3245 g) APGAR: 8, 9  Baby Feeding: Breast Disposition:home with mother   04/18/2016 Almon Herculesaye T Gonfa, MD   OB FELLOW DISCHARGE ATTESTATION  I have seen and examined this patient and agree with above documentation in the resident's note.   Silvano BilisNoah B Anastashia Westerfeld, MD 11:46 AM

## 2016-04-18 NOTE — Lactation Note (Signed)
This note was copied from a baby's chart. Lactation Consultation Note  Patient Name: Kristin Bentley [name] ZOXWR'UToday's Date: 04/18/2016 Reason for consult: Follow-up assessment   Follow up with first time mom of 541 hour old infant. Infant with 11 BF for 10-20 minutes, 6 voids and 7 stools in the last 24 hours. Infant weight 6 lb 11.1 oz with 6% weight loss since birth. LATCH Scores 7 by bedside RN.  Mom reports her breast are feeling fuller. She reports some nipple tenderness with latch that improves, nipple care reviewed. Infant was asleep on mom's chest and awakened to feed. Assisted mom in latching him to left breast in football hold. Breasts are large and soft. Nipple short shafted and erect, nipple tissue intact.  Infant able to latch easily with flanged lips and rhythmic suckling, he pulled on and off in the beginning of the feeding then settled in and fed for longer period of time, intermittent swallows noted. Colostrum was easily expressed. Mom did well with latching and supporting infant at breast.   Mom has a pump in her room and says she used once. Mom is a Wic client, enc her to call and make follow up appt. Infant with Ped appt Friday afternoon.  Reviewed all BF information in Taking Care of Baby and Me Booklet. Reviewed engorgement prevention/treatment with mom. Reviewed I/O and enc parents to maintain feeding log and take to ped appt.  Reviewed LC brochure, mom aware of OP Services, BF Support Groups and LC phone #. Enc mom to call with questions/concerns.    Maternal Data Does the patient have breastfeeding experience prior to this delivery?: No  Feeding Feeding Type: Breast Fed Length of feed: 15 min  LATCH Score/Interventions Latch: Repeated attempts needed to sustain latch, nipple held in mouth throughout feeding, stimulation needed to elicit sucking reflex. Intervention(s): Adjust position;Assist with latch;Breast massage;Breast compression  Audible Swallowing: A few with  stimulation Intervention(s): Skin to skin;Hand expression Intervention(s): Skin to skin;Hand expression  Type of Nipple: Everted at rest and after stimulation  Comfort (Breast/Nipple): Soft / non-tender     Hold (Positioning): Assistance needed to correctly position infant at breast and maintain latch. Intervention(s): Breastfeeding basics reviewed;Support Pillows;Position options;Skin to skin  LATCH Score: 7  Lactation Tools Discussed/Used WIC Program: Yes Pump Review: Setup, frequency, and cleaning;Milk Storage   Consult Status Consult Status: Complete Follow-up type: Call as needed    Ed BlalockSharon S Sanjiv Castorena 04/18/2016, 10:30 AM

## 2016-05-01 ENCOUNTER — Encounter (HOSPITAL_COMMUNITY): Payer: Self-pay

## 2016-10-29 NOTE — L&D Delivery Note (Signed)
   Delivery Note After receiving the epidural, pt progressed quickly. She had a 2 contraction 2nd stage, and at  a viable female was delivered via Vaginal, Spontaneous Delivery at [id number] (Presentation: LOA ).  APGAR:8/9 weight  .Pending.  After 1 minute, the cord was clamped and cut. 40 units of pitocin diluted in 1000cc LR was infused rapidly IV.  The placenta separated spontaneously and delivered via CCT and maternal pushing effort.  It was inspected and appears to be intact with a 3 VC.  Anesthesia:  epidural Episiotomy:  none Lacerations:  none Suture Repair:  Est. Blood Loss (mL):  100  Mom to postpartum.  Baby to Couplet care / Skin to Skin.  The above by Dr. Galen ManilaZeitler, MD under my direct supervision  Kristin Bentley,Kristin Bentley 04/19/2017, 12:30 AM

## 2016-11-27 ENCOUNTER — Inpatient Hospital Stay (HOSPITAL_COMMUNITY)
Admission: AD | Admit: 2016-11-27 | Discharge: 2016-11-27 | Disposition: A | Discharge status: Home / Self Care | Payer: Medicaid Other | Source: Ambulatory Visit | Attending: Obstetrics and Gynecology | Admitting: Obstetrics and Gynecology

## 2016-11-27 ENCOUNTER — Encounter (HOSPITAL_COMMUNITY): Payer: Self-pay | Admitting: *Deleted

## 2016-11-27 DIAGNOSIS — O99612 Diseases of the digestive system complicating pregnancy, second trimester: Secondary | ICD-10-CM | POA: Insufficient documentation

## 2016-11-27 DIAGNOSIS — R1010 Upper abdominal pain, unspecified: Secondary | ICD-10-CM | POA: Insufficient documentation

## 2016-11-27 DIAGNOSIS — Z3A17 17 weeks gestation of pregnancy: Secondary | ICD-10-CM | POA: Insufficient documentation

## 2016-11-27 DIAGNOSIS — Z87891 Personal history of nicotine dependence: Secondary | ICD-10-CM | POA: Diagnosis not present

## 2016-11-27 DIAGNOSIS — R109 Unspecified abdominal pain: Secondary | ICD-10-CM

## 2016-11-27 DIAGNOSIS — O210 Mild hyperemesis gravidarum: Secondary | ICD-10-CM | POA: Diagnosis not present

## 2016-11-27 DIAGNOSIS — K219 Gastro-esophageal reflux disease without esophagitis: Secondary | ICD-10-CM | POA: Diagnosis not present

## 2016-11-27 DIAGNOSIS — O219 Vomiting of pregnancy, unspecified: Secondary | ICD-10-CM

## 2016-11-27 DIAGNOSIS — O26899 Other specified pregnancy related conditions, unspecified trimester: Secondary | ICD-10-CM | POA: Diagnosis not present

## 2016-11-27 HISTORY — DX: Essential (primary) hypertension: I10

## 2016-11-27 HISTORY — DX: Anxiety disorder, unspecified: F41.9

## 2016-11-27 LAB — COMPREHENSIVE METABOLIC PANEL
ALBUMIN: 3.3 g/dL — AB (ref 3.5–5.0)
ALK PHOS: 85 U/L (ref 38–126)
ALT: 30 U/L (ref 14–54)
ANION GAP: 10 (ref 5–15)
AST: 20 U/L (ref 15–41)
BILIRUBIN TOTAL: 0.4 mg/dL (ref 0.3–1.2)
BUN: 6 mg/dL (ref 6–20)
CALCIUM: 8.9 mg/dL (ref 8.9–10.3)
CO2: 23 mmol/L (ref 22–32)
Chloride: 99 mmol/L — ABNORMAL LOW (ref 101–111)
Creatinine, Ser: 0.51 mg/dL (ref 0.44–1.00)
GFR calc Af Amer: >60 mL/min (ref >60)
GFR calc non Af Amer: >60 mL/min (ref >60)
GLUCOSE: 82 mg/dL (ref 65–99)
Potassium: 3.4 mmol/L — ABNORMAL LOW (ref 3.5–5.1)
Sodium: 132 mmol/L — ABNORMAL LOW (ref 135–145)
TOTAL PROTEIN: 7.4 g/dL (ref 6.5–8.1)

## 2016-11-27 LAB — WET PREP, GENITAL
Clue Cells Wet Prep HPF POC: NONE SEEN
SPERM: NONE SEEN
Trich, Wet Prep: NONE SEEN

## 2016-11-27 LAB — URINALYSIS, ROUTINE W REFLEX MICROSCOPIC
BILIRUBIN URINE: NEGATIVE
Glucose, UA: NEGATIVE mg/dL
HGB URINE DIPSTICK: NEGATIVE
KETONES UR: NEGATIVE mg/dL
NITRITE: NEGATIVE
Protein, ur: NEGATIVE mg/dL
SPECIFIC GRAVITY, URINE: 1.015 (ref 1.005–1.030)
pH: 6 (ref 5.0–8.0)

## 2016-11-27 LAB — LIPASE, BLOOD: Lipase: 17 U/L (ref 11–51)

## 2016-11-27 LAB — CBC
HEMATOCRIT: 30 % — AB (ref 36.0–46.0)
HEMOGLOBIN: 9 g/dL — AB (ref 12.0–15.0)
MCH: 17.9 pg — ABNORMAL LOW (ref 26.0–34.0)
MCHC: 30 g/dL (ref 30.0–36.0)
MCV: 59.6 fL — ABNORMAL LOW (ref 78.0–100.0)
Platelets: 299 10*3/uL (ref 150–400)
RBC: 5.03 MIL/uL (ref 3.87–5.11)
RDW: 19.9 % — ABNORMAL HIGH (ref 11.5–15.5)
WBC: 8.7 10*3/uL (ref 4.0–10.5)

## 2016-11-27 MED ORDER — ONDANSETRON HCL 4 MG/2ML IJ SOLN
4.0000 mg | Freq: Once | INTRAMUSCULAR | Status: AC
  Administered 2016-11-27: 4 mg via INTRAVENOUS
  Filled 2016-11-27: qty 2

## 2016-11-27 MED ORDER — PROMETHAZINE HCL 25 MG PO TABS: 25.0000 mg | ORAL_TABLET | Freq: Four times a day (QID) | ORAL | 0 refills | Status: DC | PRN

## 2016-11-27 MED ORDER — FAMOTIDINE IN NACL 20-0.9 MG/50ML-% IV SOLN
20.0000 mg | Freq: Once | INTRAVENOUS | Status: AC
  Administered 2016-11-27: 20 mg via INTRAVENOUS
  Filled 2016-11-27: qty 50

## 2016-11-27 MED ORDER — RANITIDINE HCL 150 MG PO TABS: 150.0000 mg | ORAL_TABLET | Freq: Every day | ORAL | 1 refills | Status: DC

## 2016-11-27 MED ORDER — LACTATED RINGERS IV BOLUS (SEPSIS)
1000.0000 mL | Freq: Once | INTRAVENOUS | Status: AC
  Administered 2016-11-27: 1000 mL via INTRAVENOUS

## 2016-11-27 MED ORDER — GI COCKTAIL ~~LOC~~
30.0000 mL | Freq: Once | ORAL | Status: AC
  Administered 2016-11-27: 30 mL via ORAL
  Filled 2016-11-27: qty 30

## 2016-11-27 NOTE — MAU Note (Signed)
Pt came EMS  [facility]  Earlier this morning was trying to calm the baby daddy down, someone else was egging him on.  She started having pain in her upper abd, this started around 0900. Still having the pain.

## 2016-11-27 NOTE — MAU Note (Signed)
Urine in lab 

## 2016-11-27 NOTE — MAU Note (Signed)
C/o upper abdominal pain that started today around 0900 this AM; describes pain as sharp stabbing pain; denies any abdominal pain or vaginal bleeding;

## 2016-11-27 NOTE — Discharge Instructions (Signed)
Morning Sickness Morning sickness is when you feel sick to your stomach (nauseous) during pregnancy. You may feel sick to your stomach and throw up (vomit). You may feel sick in the morning, but you can feel this way any time of day. Some women feel very sick to their stomach and cannot stop throwing up (hyperemesis gravidarum). Follow these instructions at home:  Only take medicines as told by your doctor.  Take multivitamins as told by your doctor. Taking multivitamins before getting pregnant can stop or lessen the harshness of morning sickness.  Eat dry toast or unsalted crackers before getting out of bed.  Eat 5 to 6 small meals a day.  Eat dry and bland foods like rice and baked potatoes.  Do not drink liquids with meals. Drink between meals.  Do not eat greasy, fatty, or spicy foods.  Have someone cook for you if the smell of food causes you to feel sick or throw up.  If you feel sick to your stomach after taking prenatal vitamins, take them at night or with a snack.  Eat protein when you need a snack (nuts, yogurt, cheese).  Eat unsweetened gelatins for dessert.  Wear a bracelet used for sea sickness (acupressure wristband).  Go to a doctor that puts thin needles into certain body points (acupuncture) to improve how you feel.  Do not smoke.  Use a humidifier to keep the air in your house free of odors.  Get lots of fresh air. Contact a doctor if:  You need medicine to feel better.  You feel dizzy or lightheaded.  You are losing weight. Get help right away if:  You feel very sick to your stomach and cannot stop throwing up.  You pass out (faint). This information is not intended to replace advice given to you by your health care provider. Make sure you discuss any questions you have with your health care provider. Document Released: 11/22/2004 Document Revised: 03/22/2016 Document Reviewed: 04/01/2013 Elsevier Interactive Patient Education  2017 Elsevier  Inc. Heartburn Introduction Heartburn is a type of pain or discomfort that can happen in the throat or chest. It is often described as a burning pain. It may also cause a bad taste in the mouth. Heartburn may feel worse when you lie down or bend over. It may be caused by stomach contents that move back up (reflux) into the tube that connects the mouth with the stomach (esophagus). Follow these instructions at home: Take these actions to lessen your discomfort and to help avoid problems. Diet  Follow a diet as told by your doctor. You may need to avoid foods and drinks such as:  Coffee and tea (with or without caffeine).  Drinks that contain alcohol.  Energy drinks and sports drinks.  Carbonated drinks or sodas.  Chocolate and cocoa.  Peppermint and mint flavorings.  Garlic and onions.  Horseradish.  Spicy and acidic foods, such as peppers, chili powder, curry powder, vinegar, hot sauces, and BBQ sauce.  Citrus fruit juices and citrus fruits, such as oranges, lemons, and limes.  Tomato-based foods, such as red sauce, chili, salsa, and pizza with red sauce.  Fried and fatty foods, such as donuts, french fries, potato chips, and high-fat dressings.  High-fat meats, such as hot dogs, rib eye steak, sausage, ham, and bacon.  High-fat dairy items, such as whole milk, butter, and cream cheese.  Eat small meals often. Avoid eating large meals.  Avoid drinking large amounts of liquid with your meals.  Avoid eating  meals during the 2-3 hours before bedtime.  Avoid lying down right after you eat.  Do not exercise right after you eat. General instructions  Pay attention to any changes in your symptoms.  Take over-the-counter and prescription medicines only as told by your doctor. Do not take aspirin, ibuprofen, or other NSAIDs unless your doctor says it is okay.  Do not use any tobacco products, including cigarettes, chewing tobacco, and e-cigarettes. If you need help  quitting, ask your doctor.  Wear loose clothes. Do not wear anything tight around your waist.  Raise (elevate) the head of your bed about 6 inches (15 cm).  Try to lower your stress. If you need help doing this, ask your doctor.  If you are overweight, lose an amount of weight that is healthy for you. Ask your doctor about a safe weight loss goal.  Keep all follow-up visits as told by your doctor. This is important. Contact a doctor if:  You have new symptoms.  You lose weight and you do not know why it is happening.  You have trouble swallowing, or it hurts to swallow.  You have wheezing or a cough that keeps happening.  Your symptoms do not get better with treatment.  You have heartburn often for more than two weeks. Get help right away if:  You have pain in your arms, neck, jaw, teeth, or back.  You feel sweaty, dizzy, or light-headed.  You have chest pain or shortness of breath.  You throw up (vomit) and your throw up looks like blood or coffee grounds.  Your poop (stool) is bloody or black. This information is not intended to replace advice given to you by your health care provider. Make sure you discuss any questions you have with your health care provider. Document Released: 06/27/2011 Document Revised: 03/22/2016 Document Reviewed: 02/09/2015  2017 Elsevier

## 2016-11-27 NOTE — MAU Provider Note (Signed)
History     CSN: [id number]  Arrival date and time: 11/27/16 1312   None     No chief complaint on file.  G2p1001 @17 .4 weeks here with upper abdominal pain since yesterday. She describes as sharp and constant. She has not used anything for the pain. She denies fever. She reports 4 episodes of vomiting today. She denies VB, LOF, or vaginal discharge. She endorses pelvic pressure for several days. She is being seen at [facility] for [facility] and states she has [place] scheduled tomorrow but cannot recall address. She reports recent dx of CT and was treated 2 weeks ago as well as partner.     OB History    Gravida Para Term Preterm AB Living   2 1 1     1    SAB TAB Ectopic Multiple Live Births         0 1      Past Medical History:  Diagnosis Date  . ADHD (attention deficit hyperactivity disorder)   . Anemia   . Anxiety   . Asthma   . Chlamydia   . Gonorrhea   . Hypertension     Past Surgical History:  Procedure Laterality Date  . WISDOM TOOTH EXTRACTION      Family History  Problem Relation Age of Onset  . Heart disease Mother   . Diabetes Mother   . Hypertension Mother   . Asthma Sister   . Diabetes Maternal Grandmother     Social History  Substance Use Topics  . Smoking status: Former Games developer  . Smokeless tobacco: Former Neurosurgeon  . Alcohol use No    Allergies:  Allergies  Allergen Reactions  . Bee Venom Anaphylaxis  . Citrus Anaphylaxis    Prescriptions Prior to Admission  Medication Sig Dispense Refill Last Dose  . acetaminophen (TYLENOL) 325 MG tablet Take 2 tablets (650 mg total) by mouth every 4 (four) hours as needed (for pain scale < 4). 30 tablet 0   . albuterol (PROVENTIL HFA;VENTOLIN HFA) 108 (90 BASE) MCG/ACT inhaler Inhale 2 puffs into the lungs every 6 (six) hours as needed for wheezing or shortness of breath. 1 Inhaler 1 Past Month at Unknown time  . amLODipine (NORVASC) 5 MG tablet Take 1 tablet (5 mg total) by mouth daily. 90 tablet 3  04/15/2016 at Unknown time  . EPINEPHrine (EPIPEN 2-PAK) 0.3 mg/0.3 mL IJ SOAJ injection Inject 0.3 mg into the muscle once as needed (for severe allergic reaction).   PRN at PRN  . ferrous sulfate 325 (65 FE) MG tablet Take 325 mg by mouth 2 (two) times daily with a meal.   04/15/2016 at Unknown time  . ibuprofen (ADVIL,MOTRIN) 600 MG tablet Take 1 tablet (600 mg total) by mouth every 6 (six) hours. 30 tablet 0   . montelukast (SINGULAIR) 10 MG tablet Take 10 mg by mouth at bedtime.   04/15/2016 at Unknown time  . Prenatal Vit-Fe Fumarate-FA (PRENATAL MULTIVITAMIN) TABS tablet Take 1 tablet by mouth daily.    04/15/2016 at Unknown time    Review of Systems  Constitutional: Negative for fever.  Gastrointestinal: Positive for abdominal pain and nausea.  Genitourinary: Negative for dysuria, vaginal bleeding and vaginal discharge.   Physical Exam   Blood pressure 111/76, pulse 111, temperature 98.6 F (37 C), temperature source Oral, resp. rate 18, height 5\' 7"  (1.702 m), weight 115.2 kg (254 lb), last menstrual period 07/27/2016, unknown if currently breastfeeding.  Physical Exam  Nursing note and  vitals reviewed. Constitutional: She is oriented to person, place, and time. She appears well-developed and well-nourished. No distress.  HENT:  Head: Normocephalic and atraumatic.  Neck: Normal range of motion.  Cardiovascular: Normal rate.   Respiratory: Effort normal.  GI: Soft. She exhibits no distension and no mass. There is tenderness (mild upper quadrants). There is no rebound and no guarding.  Genitourinary:  Genitourinary Comments: External: no lesions or erythema Vagina: rugated, parous, thin frothy yellow discharge SVE: closed/thick  Musculoskeletal: Normal range of motion.  Neurological: She is alert and oriented to person, place, and time.  Skin: Skin is warm and dry.  Psychiatric: She has a normal mood and affect.  FHT: 155 bpm  Results for orders placed or performed during the  hospital encounter of 11/27/16 (from the past 24 hour(s))  Urinalysis, Routine w reflex microscopic     Status: Abnormal   Collection Time: 11/27/16  1:45 PM  Result Value Ref Range   Color, Urine YELLOW YELLOW   APPearance CLOUDY (A) CLEAR   Specific Gravity, Urine 1.015 1.005 - 1.030   pH 6.0 5.0 - 8.0   Glucose, UA NEGATIVE NEGATIVE mg/dL   Hgb urine dipstick NEGATIVE NEGATIVE   Bilirubin Urine NEGATIVE NEGATIVE   Ketones, ur NEGATIVE NEGATIVE mg/dL   Protein, ur NEGATIVE NEGATIVE mg/dL   Nitrite NEGATIVE NEGATIVE   Leukocytes, UA LARGE (A) NEGATIVE   RBC / HPF 0-5 0 - 5 RBC/hpf   WBC, UA 6-30 0 - 5 WBC/hpf   Bacteria, UA RARE (A) NONE SEEN   Squamous Epithelial / LPF 0-5 (A) NONE SEEN   Mucous PRESENT   Wet prep, genital     Status: Abnormal   Collection Time: 11/27/16  4:20 PM  Result Value Ref Range   Yeast Wet Prep HPF POC PRESENT (A) NONE SEEN   Trich, Wet Prep NONE SEEN NONE SEEN   Clue Cells Wet Prep HPF POC NONE SEEN NONE SEEN   WBC, Wet Prep HPF POC MANY (A) NONE SEEN   Sperm NONE SEEN   CBC     Status: Abnormal   Collection Time: 11/27/16  4:41 PM  Result Value Ref Range   WBC 8.7 4.0 - 10.5 K/uL   RBC 5.03 3.87 - 5.11 MIL/uL   Hemoglobin 9.0 (L) 12.0 - 15.0 g/dL   HCT 40.930.0 (L) 81.136.0 - 91.446.0 %   MCV 59.6 (L) 78.0 - 100.0 fL   MCH 17.9 (L) 26.0 - 34.0 pg   MCHC 30.0 30.0 - 36.0 g/dL   RDW 78.219.9 (H) 95.611.5 - 21.315.5 %   Platelets 299 150 - 400 K/uL  Comprehensive metabolic panel     Status: Abnormal   Collection Time: 11/27/16  4:41 PM  Result Value Ref Range   Sodium 132 (L) 135 - 145 mmol/L   Potassium 3.4 (L) 3.5 - 5.1 mmol/L   Chloride 99 (L) 101 - 111 mmol/L   CO2 23 22 - 32 mmol/L   Glucose, Bld 82 65 - 99 mg/dL   BUN 6 6 - 20 mg/dL   Creatinine, Ser 0.860.51 0.44 - 1.00 mg/dL   Calcium 8.9 8.9 - 57.810.3 mg/dL   Total Protein 7.4 6.5 - 8.1 g/dL   Albumin 3.3 (L) 3.5 - 5.0 g/dL   AST 20 15 - 41 U/L   ALT 30 14 - 54 U/L   Alkaline Phosphatase 85 38 - 126 U/L    Total Bilirubin 0.4 0.3 - 1.2 mg/dL   GFR calc non  Af Amer >60 >60 mL/min   GFR calc Af Amer >60 >60 mL/min   Anion gap 10 5 - 15  Lipase, blood     Status: None   Collection Time: 11/27/16  4:41 PM  Result Value Ref Range   Lipase 17 11 - 51 U/L    MAU Course  Procedures GI cocktail LR 1 L Pepcid 20 mg IV Zofran 4mg  IV  MDM Labs ordered and reviewed. Emesis after GI cocktail. Tolerating po and no further emesis after Zofran and IVF, nausea improved. No evidence of acute abdominal process. Stable for discharge home.    Assessment and Plan   1. [redacted] weeks gestation of pregnancy   2. Gastroesophageal reflux disease without esophagitis   3. Nausea/vomiting in pregnancy   4. Abdominal pain affecting pregnancy    Discharge home [facility] at [facility] to [facility] asap Rx Phenergan Rx Zantac  Allergies as of 11/27/2016      Reactions   Bee Venom Anaphylaxis   Citrus Anaphylaxis      Medication List    TAKE these medications   albuterol 108 (90 Base) MCG/ACT inhaler Commonly known as:  PROVENTIL HFA;VENTOLIN HFA Inhale 2 puffs into the lungs every 6 (six) hours as needed for wheezing or shortness of breath.   EPIPEN 2-PAK 0.3 mg/0.3 mL Soaj injection Generic drug:  EPINEPHrine Inject 0.3 mg into the muscle once as needed (for severe allergic reaction).   ferrous sulfate 325 (65 FE) MG tablet Take 325 mg by mouth 2 (two) times daily with a meal.   prenatal multivitamin Tabs tablet Take 1 tablet by mouth daily.   promethazine 25 MG tablet Commonly known as:  PHENERGAN Take 1 tablet (25 mg total) by mouth every 6 (six) hours as needed for nausea or vomiting.   ranitidine 150 MG tablet Commonly known as:  ZANTAC Take 1 tablet (150 mg total) by mouth at bedtime.      Donette Larry, CNM 11/27/2016, 4:29 PM

## 2016-11-28 LAB — GC/CHLAMYDIA PROBE AMP (~~LOC~~) NOT AT ARMC
CHLAMYDIA, DNA PROBE: NEGATIVE
NEISSERIA GONORRHEA: NEGATIVE

## 2016-12-26 ENCOUNTER — Encounter: Payer: Self-pay | Admitting: Certified Nurse Midwife

## 2016-12-26 ENCOUNTER — Ambulatory Visit (INDEPENDENT_AMBULATORY_CARE_PROVIDER_SITE_OTHER): Payer: Medicaid Other | Admitting: Certified Nurse Midwife

## 2016-12-26 ENCOUNTER — Other Ambulatory Visit (HOSPITAL_COMMUNITY)
Admission: RE | Admit: 2016-12-26 | Discharge: 2016-12-26 | Disposition: A | Discharge status: Home / Self Care | Payer: Medicaid Other | Source: Ambulatory Visit | Attending: Certified Nurse Midwife | Admitting: Certified Nurse Midwife

## 2016-12-26 VITALS — BP 108/74 | HR 97 | Wt 261.0 lb

## 2016-12-26 DIAGNOSIS — O09899 Supervision of other high risk pregnancies, unspecified trimester: Secondary | ICD-10-CM

## 2016-12-26 DIAGNOSIS — Z01411 Encounter for gynecological examination (general) (routine) with abnormal findings: Secondary | ICD-10-CM | POA: Diagnosis present

## 2016-12-26 DIAGNOSIS — O219 Vomiting of pregnancy, unspecified: Secondary | ICD-10-CM

## 2016-12-26 DIAGNOSIS — J4542 Moderate persistent asthma with status asthmaticus: Secondary | ICD-10-CM

## 2016-12-26 DIAGNOSIS — Z113 Encounter for screening for infections with a predominantly sexual mode of transmission: Secondary | ICD-10-CM | POA: Diagnosis present

## 2016-12-26 DIAGNOSIS — Z8742 Personal history of other diseases of the female genital tract: Secondary | ICD-10-CM | POA: Insufficient documentation

## 2016-12-26 DIAGNOSIS — O0990 Supervision of high risk pregnancy, unspecified, unspecified trimester: Secondary | ICD-10-CM | POA: Insufficient documentation

## 2016-12-26 DIAGNOSIS — O0932 Supervision of pregnancy with insufficient antenatal care, second trimester: Secondary | ICD-10-CM | POA: Diagnosis not present

## 2016-12-26 DIAGNOSIS — Z87898 Personal history of other specified conditions: Secondary | ICD-10-CM

## 2016-12-26 MED ORDER — ONDANSETRON HCL 8 MG PO TABS: 8.0000 mg | ORAL_TABLET | Freq: Three times a day (TID) | ORAL | 2 refills | Status: DC | PRN

## 2016-12-26 MED ORDER — PRENATE PIXIE 10-0.6-0.4-200 MG PO CAPS: 1.0000 | ORAL_CAPSULE | Freq: Every day | ORAL | 12 refills | Status: DC

## 2016-12-26 MED ORDER — BECLOMETHASONE DIPROP HFA 40 MCG/ACT IN AERB: 2.0000 | INHALATION_SPRAY | Freq: Every day | RESPIRATORY_TRACT | 99 refills | Status: DC

## 2016-12-26 NOTE — Progress Notes (Signed)
Subjective:    Kristin Bentley is being seen today for her first obstetrical visit.  This is not a planned pregnancy. She is at [redacted]w[redacted]d gestation. Her obstetrical history is significant for obesity and asthma, CHTN, close spaced pregnancies. Relationship with FOB: significant other, living together. Patient does intend to breast feed. Pregnancy history fully reviewed.  The information documented in the HPI was reviewed and verified.  Menstrual History: OB History    Gravida Para Term Preterm AB Living   2 1 1     1    SAB TAB Ectopic Multiple Live Births         0 1       Patient's last menstrual period was 07/27/2016.    Past Medical History:  Diagnosis Date  . ADHD (attention deficit hyperactivity disorder)   . Anemia   . Anxiety   . Asthma   . Chlamydia   . Gonorrhea   . Hypertension     Past Surgical History:  Procedure Laterality Date  . WISDOM TOOTH EXTRACTION       (Not in a hospital admission) Allergies  Allergen Reactions  . Bee Venom Anaphylaxis  . Citrus Anaphylaxis    Social History  Substance Use Topics  . Smoking status: Former Games developer  . Smokeless tobacco: Former Neurosurgeon  . Alcohol use No    Family History  Problem Relation Age of Onset  . Heart disease Mother   . Diabetes Mother   . Hypertension Mother   . Asthma Sister   . Diabetes Maternal Grandmother      Review of Systems Constitutional: negative for weight loss Gastrointestinal: + for nausea & vomiting Genitourinary:negative for genital lesions and vaginal discharge and dysuria Musculoskeletal:negative for back pain Behavioral/Psych: negative for abusive relationship, depression, illegal drug usage and tobacco use    Objective:    BP 108/74   Pulse 97   Wt 261 lb (118.4 kg)   LMP 07/27/2016   BMI 40.88 kg/m  General Appearance:    Alert, cooperative, no distress, appears stated age  Head:    Normocephalic, without obvious abnormality, atraumatic  Eyes:    PERRL,  conjunctiva/corneas clear, EOM's intact, fundi    benign, both eyes  Ears:    Normal TM's and external ear canals, both ears  Nose:   Nares normal, septum midline, mucosa normal, no drainage    or sinus tenderness  Throat:   Lips, mucosa, and tongue normal; teeth and gums normal  Neck:   Supple, symmetrical, trachea midline, no adenopathy;    thyroid:  no enlargement/tenderness/nodules; no carotid   bruit or JVD  Back:     Symmetric, no curvature, ROM normal, no CVA tenderness  Lungs:     Clear to auscultation bilaterally, respirations unlabored  Chest Wall:    No tenderness or deformity   Heart:    Regular rate and rhythm, S1 and S2 normal, no murmur, rub   or gallop  Breast Exam:    No tenderness, masses, or nipple abnormality  Abdomen:     Soft, non-tender, bowel sounds active all four quadrants,    no masses, no organomegaly  Genitalia:    Normal female without lesion, discharge or tenderness  Extremities:   Extremities normal, atraumatic, no cyanosis or edema  Pulses:   2+ and symmetric all extremities  Skin:   Skin color, texture, turgor normal, no rashes or lesions  Lymph nodes:   Cervical, supraclavicular, and axillary nodes normal  Neurologic:   CNII-XII intact, normal  strength, sensation and reflexes    throughout        Cervix: long, thick, closed and posterior.  FH: 25 cm.  FHR: 150 by doppler    Lab Review Urine pregnancy test Labs reviewed yes Radiologic studies reviewed no Assessment:    Pregnancy at [redacted]w[redacted]d weeks     Supervision of high risk pregnancy, antepartum - Plan: Cytology - PAP, Cervicovaginal ancillary only, Hemoglobinopathy evaluation, Varicella zoster antibody, IgG, VITAMIN D 25 Hydroxy (Vit-D Deficiency, Fractures), Culture, OB Urine, Cystic Fibrosis Mutation 97, MaterniT21 PLUS Core+SCA, Hemoglobin A1c, Obstetric Panel, Including HIV, AFP, Serum, Open Spina Bifida, [place] MFM OB DETAIL +14 WK, Prenat-FeAsp-Meth-FA-DHA w/o A (PRENATE PIXIE) 10-0.6-0.4-200 MG  CAPS  Nausea and vomiting during pregnancy - Plan: ondansetron (ZOFRAN) 8 MG tablet  History of abnormal cervical Pap smear  Short interval between pregnancies affecting pregnancy, antepartum  Late prenatal care affecting pregnancy in second trimester  Moderate persistent chronic asthma with status asthmaticus - Plan: Ambulatory referral to Pulmonology, Beclomethasone Diprop HFA (QVAR REDIHALER) 40 MCG/ACT AERB   Plan:    RX written for abdominal support belt.   OTC baby ASA Prenatal vitamins.  Counseling provided regarding continued use of seat belts, cessation of alcohol consumption, smoking or use of illicit drugs; infection precautions i.e., influenza/TDAP immunizations, toxoplasmosis,CMV, parvovirus, listeria and varicella; workplace safety, exercise during pregnancy; routine dental care, safe medications, sexual activity, hot tubs, saunas, pools, travel, caffeine use, fish and methlymercury, potential toxins, hair treatments, varicose veins Weight gain recommendations per IOM guidelines reviewed: underweight/BMI< 18.5--> gain 28 - 40 lbs; normal weight/BMI 18.5 - 24.9--> gain 25 - 35 lbs; overweight/BMI 25 - 29.9--> gain 15 - 25 lbs; obese/BMI >30->gain  11 - 20 lbs Problem list reviewed and updated. FIRST/CF mutation testing/NIPT/QUAD SCREEN/fragile X/Ashkenazi Jewish population testing/Spinal muscular atrophy discussed: ordered. Role of ultrasound in pregnancy discussed; fetal survey: ordered. Amniocentesis discussed: not indicated.  Meds ordered this encounter  Medications  . Prenat-FeAsp-Meth-FA-DHA w/o A (PRENATE PIXIE) 10-0.6-0.4-200 MG CAPS    Sig: Take 1 tablet by mouth daily.    Dispense:  30 capsule    Refill:  12    Please process coupon: Rx BIN: [id number], RxPCN: [id number], RxGRP: [id number], RxID: [id number]  SUF: 01  . ondansetron (ZOFRAN) 8 MG tablet    Sig: Take 1 tablet (8 mg total) by mouth every 8 (eight) hours as needed for nausea or vomiting.    Dispense:  40  tablet    Refill:  2  . Beclomethasone Diprop HFA (QVAR REDIHALER) 40 MCG/ACT AERB    Sig: Inhale 2 puffs into the lungs daily.    Dispense:  10.6 g    Refill:  PRN   Orders Placed This Encounter  Procedures  . Culture, OB Urine  . [place] MFM OB DETAIL +14 WK    Standing Status:   Future    Standing Expiration Date:   02/23/2018    Order Specific Question:   Reason for Exam (SYMPTOM  OR DIAGNOSIS REQUIRED)    Answer:   anatomy scan, dating    Order Specific Question:   Preferred imaging location?    Answer:   [facility]  . Hemoglobinopathy evaluation  . Varicella zoster antibody, IgG  . VITAMIN D 25 Hydroxy (Vit-D Deficiency, Fractures)  . Cystic Fibrosis Mutation 97  . MaterniT21 PLUS Core+SCA    Order Specific Question:   Is the patient insulin dependent?    Answer:   No    Order Specific Question:   Please  enter gestational age. This should be expressed as weeks AND days, i.e. 16w 6d. Enter weeks here. Enter days in next question.    Answer:   1321    Order Specific Question:   Please enter gestational age. This should be expressed as weeks AND days, i.e. 16w 6d. Enter days here. Enter weeks in previous question.    Answer:   5    Order Specific Question:   How was gestational age calculated?    Answer:   LMP    Order Specific Question:   Please give the date of LMP OR Ultrasound OR Estimated date of delivery.    Answer:   05/03/2017    Order Specific Question:   Number of Fetuses (Type of Pregnancy):    Answer:   1    Order Specific Question:   Indications for performing the test? (please choose all that apply):    Answer:   Routine screening    Order Specific Question:   Other Indications? (Y=Yes, N=No)    Answer:   N    Order Specific Question:   If this is a repeat specimen, please indicate the reason:    Answer:   Not indicated    Order Specific Question:   Please specify the patient's race: (C=White/Caucasion, B=Black, I=Native American, A=Asian, H=Hispanic, O=Other,  U=Unknown)    Answer:   B    Order Specific Question:   Donor Egg - indicate if the egg was obtained from in vitro fertilization.    Answer:   N    Order Specific Question:   Age of Egg Donor.    Answer:   [age]    Order Specific Question:   Prior Down Syndrome/ONTD screening during current pregnancy.    Answer:   N    Order Specific Question:   Prior First Trimester Testing    Answer:   N    Order Specific Question:   Prior Second Trimester Testing    Answer:   N    Order Specific Question:   Family History of Neural Tube Defects    Answer:   N    Order Specific Question:   Prior Pregnancy with Down Syndrome    Answer:   N    Order Specific Question:   Please give the patient's weight (in pounds)    Answer:   254  . Hemoglobin A1c  . Obstetric Panel, Including HIV  . AFP, Serum, Open Spina Bifida    Order Specific Question:   Is patient insulin dependent?    Answer:   No    Order Specific Question:   Patient weight (lb.)    Answer:   27254    Order Specific Question:   Gestational Age (GA), weeks    Answer:   21.5    Order Specific Question:   Date on which patient was at this GA    Answer:   12/26/2016    Order Specific Question:   GA Calculation Method    Answer:   LMP    Order Specific Question:   GA Date    Answer:   05/03/2017    Order Specific Question:   Number of fetuses    Answer:   1    Order Specific Question:   Donor egg?    Answer:   N    Order Specific Question:   Age of egg donor?    Answer:   [age]  . Ambulatory referral to Pulmonology  Referral Priority:   Routine    Referral Type:   Consultation    Referral Reason:   Specialty Services Required    Requested Specialty:   Pulmonary Disease    Number of Visits Requested:   1    Follow up in 2 weeks. 50% of 30 min visit spent on counseling and coordination of care.

## 2016-12-28 ENCOUNTER — Encounter (HOSPITAL_COMMUNITY): Payer: Self-pay

## 2016-12-28 ENCOUNTER — Ambulatory Visit (HOSPITAL_COMMUNITY)
Admission: RE | Admit: 2016-12-28 | Discharge: 2016-12-28 | Disposition: A | Discharge status: Home / Self Care | Payer: Medicaid Other | Source: Ambulatory Visit | Attending: Certified Nurse Midwife | Admitting: Certified Nurse Midwife

## 2016-12-28 VITALS — BP 113/61 | HR 93 | Wt 260.2 lb

## 2016-12-28 DIAGNOSIS — O10919 Unspecified pre-existing hypertension complicating pregnancy, unspecified trimester: Secondary | ICD-10-CM

## 2016-12-28 DIAGNOSIS — Z3A22 22 weeks gestation of pregnancy: Secondary | ICD-10-CM | POA: Insufficient documentation

## 2016-12-28 DIAGNOSIS — O10012 Pre-existing essential hypertension complicating pregnancy, second trimester: Secondary | ICD-10-CM | POA: Diagnosis not present

## 2016-12-28 DIAGNOSIS — O99212 Obesity complicating pregnancy, second trimester: Secondary | ICD-10-CM | POA: Insufficient documentation

## 2016-12-28 DIAGNOSIS — Z363 Encounter for antenatal screening for malformations: Secondary | ICD-10-CM | POA: Insufficient documentation

## 2016-12-28 DIAGNOSIS — O0990 Supervision of high risk pregnancy, unspecified, unspecified trimester: Secondary | ICD-10-CM

## 2016-12-28 LAB — URINE CULTURE, OB REFLEX

## 2016-12-28 LAB — CERVICOVAGINAL ANCILLARY ONLY
BACTERIAL VAGINITIS: NEGATIVE
CANDIDA VAGINITIS: NEGATIVE
CHLAMYDIA, DNA PROBE: NEGATIVE
Neisseria Gonorrhea: NEGATIVE
Trichomonas: NEGATIVE

## 2016-12-28 LAB — CULTURE, OB URINE

## 2016-12-31 LAB — CYTOLOGY - PAP: Diagnosis: NEGATIVE

## 2017-01-01 ENCOUNTER — Other Ambulatory Visit: Payer: Self-pay | Admitting: Certified Nurse Midwife

## 2017-01-01 LAB — AFP, SERUM, OPEN SPINA BIFIDA
AFP MOM: 1.67
AFP VALUE AFPOSL: 93.5 ng/mL
GEST. AGE ON COLLECTION DATE: 21.7 wk
Maternal Age At EDD: 24.1 years
OSBR RISK 1 IN: 3514
Test Results:: NEGATIVE
WEIGHT: 254 [lb_av]

## 2017-01-01 LAB — HEMOGLOBIN A1C
ESTIMATED AVERAGE GLUCOSE: 103 mg/dL
HEMOGLOBIN A1C: 5.2 % (ref 4.8–5.6)

## 2017-01-01 LAB — OBSTETRIC PANEL, INCLUDING HIV
ANTIBODY SCREEN: NEGATIVE
BASOS ABS: 0 10*3/uL (ref 0.0–0.2)
Basos: 0 %
EOS (ABSOLUTE): 0.1 10*3/uL (ref 0.0–0.4)
Eos: 2 %
HIV Screen 4th Generation wRfx: NONREACTIVE
Hematocrit: 29.9 % — ABNORMAL LOW (ref 34.0–46.6)
Hemoglobin: 8.8 g/dL — ABNORMAL LOW (ref 11.1–15.9)
Hepatitis B Surface Ag: NEGATIVE
IMMATURE GRANS (ABS): 0 10*3/uL (ref 0.0–0.1)
IMMATURE GRANULOCYTES: 0 %
LYMPHS: 33 %
Lymphocytes Absolute: 2.7 10*3/uL (ref 0.7–3.1)
MCH: 17.8 pg — ABNORMAL LOW (ref 26.6–33.0)
MCHC: 29.4 g/dL — AB (ref 31.5–35.7)
MCV: 61 fL — ABNORMAL LOW (ref 79–97)
MONOCYTES: 6 %
MONOS ABS: 0.5 10*3/uL (ref 0.1–0.9)
NEUTROS PCT: 59 %
Neutrophils Absolute: 4.8 10*3/uL (ref 1.4–7.0)
Platelets: 332 10*3/uL (ref 150–379)
RBC: 4.94 x10E6/uL (ref 3.77–5.28)
RDW: 21.2 % — AB (ref 12.3–15.4)
RPR Ser Ql: NONREACTIVE
RUBELLA: 3.73 {index} (ref >0.99)
Rh Factor: POSITIVE
WBC: 8.2 10*3/uL (ref 3.4–10.8)

## 2017-01-01 LAB — HEMOGLOBINOPATHY EVALUATION
HEMOGLOBIN A2 QUANTITATION: 2.4 % (ref 1.8–3.2)
HEMOGLOBIN F QUANTITATION: 0 % (ref 0.0–2.0)
HGB A: 97.6 % (ref 96.4–98.8)
HGB C: 0 %
HGB S: 0 %
HGB VARIANT: 0 %

## 2017-01-01 LAB — VARICELLA ZOSTER ANTIBODY, IGG: VARICELLA: 1329 {index} (ref >165)

## 2017-01-01 LAB — CYSTIC FIBROSIS MUTATION 97: Interpretation: NOT DETECTED

## 2017-01-01 LAB — VITAMIN D 25 HYDROXY (VIT D DEFICIENCY, FRACTURES): VIT D 25 HYDROXY: 16.3 ng/mL — AB (ref 30.0–100.0)

## 2017-01-02 ENCOUNTER — Other Ambulatory Visit: Payer: Self-pay | Admitting: Certified Nurse Midwife

## 2017-01-02 DIAGNOSIS — O99019 Anemia complicating pregnancy, unspecified trimester: Secondary | ICD-10-CM | POA: Insufficient documentation

## 2017-01-02 DIAGNOSIS — O99012 Anemia complicating pregnancy, second trimester: Secondary | ICD-10-CM

## 2017-01-02 DIAGNOSIS — O0990 Supervision of high risk pregnancy, unspecified, unspecified trimester: Secondary | ICD-10-CM

## 2017-01-02 DIAGNOSIS — R7989 Other specified abnormal findings of blood chemistry: Secondary | ICD-10-CM | POA: Insufficient documentation

## 2017-01-02 LAB — MATERNIT21 PLUS CORE+SCA
CHROMOSOME 13: NEGATIVE
CHROMOSOME 21: NEGATIVE
Chromosome 18: NEGATIVE
Y Chromosome: DETECTED

## 2017-01-02 MED ORDER — VITAMIN D (ERGOCALCIFEROL) 1.25 MG (50000 UNIT) PO CAPS: 50000.0000 [IU] | ORAL_CAPSULE | ORAL | 2 refills | Status: DC

## 2017-01-03 ENCOUNTER — Encounter: Payer: Self-pay | Admitting: *Deleted

## 2017-01-07 ENCOUNTER — Telehealth: Payer: Self-pay | Admitting: *Deleted

## 2017-01-07 ENCOUNTER — Other Ambulatory Visit: Payer: Self-pay | Admitting: Certified Nurse Midwife

## 2017-01-07 DIAGNOSIS — J4542 Moderate persistent asthma with status asthmaticus: Secondary | ICD-10-CM

## 2017-01-07 MED ORDER — FLUTICASONE PROPIONATE HFA 110 MCG/ACT IN AERO: 2.0000 | INHALATION_SPRAY | Freq: Every day | RESPIRATORY_TRACT | 12 refills | Status: DC

## 2017-01-07 NOTE — Telephone Encounter (Signed)
Flovent HFA sent to the pharmacy.

## 2017-01-07 NOTE — Telephone Encounter (Signed)
Q var discontinued and Q var redi haler non preferred- choices are Flovent HFA inhaler or Pulmicort respules

## 2017-01-08 ENCOUNTER — Encounter: Payer: Self-pay | Admitting: *Deleted

## 2017-01-10 ENCOUNTER — Encounter: Payer: Medicaid Other | Admitting: Obstetrics and Gynecology

## 2017-01-15 ENCOUNTER — Encounter: Payer: Medicaid Other | Admitting: Obstetrics & Gynecology

## 2017-01-25 ENCOUNTER — Other Ambulatory Visit (HOSPITAL_COMMUNITY): Payer: Self-pay | Admitting: Maternal and Fetal Medicine

## 2017-01-25 ENCOUNTER — Other Ambulatory Visit (HOSPITAL_COMMUNITY): Payer: Self-pay | Admitting: *Deleted

## 2017-01-25 ENCOUNTER — Encounter (HOSPITAL_COMMUNITY): Payer: Self-pay

## 2017-01-25 ENCOUNTER — Ambulatory Visit (HOSPITAL_COMMUNITY)
Admission: RE | Admit: 2017-01-25 | Discharge: 2017-01-25 | Disposition: A | Discharge status: Home / Self Care | Payer: Medicaid Other | Source: Ambulatory Visit | Attending: Certified Nurse Midwife | Admitting: Certified Nurse Midwife

## 2017-01-25 DIAGNOSIS — Z3A26 26 weeks gestation of pregnancy: Secondary | ICD-10-CM

## 2017-01-25 DIAGNOSIS — O99212 Obesity complicating pregnancy, second trimester: Secondary | ICD-10-CM

## 2017-01-25 DIAGNOSIS — E669 Obesity, unspecified: Secondary | ICD-10-CM | POA: Diagnosis not present

## 2017-01-25 DIAGNOSIS — R6252 Short stature (child): Secondary | ICD-10-CM

## 2017-01-25 DIAGNOSIS — O10919 Unspecified pre-existing hypertension complicating pregnancy, unspecified trimester: Secondary | ICD-10-CM

## 2017-01-25 DIAGNOSIS — O10912 Unspecified pre-existing hypertension complicating pregnancy, second trimester: Secondary | ICD-10-CM | POA: Diagnosis present

## 2017-01-25 DIAGNOSIS — Z6841 Body Mass Index (BMI) 40.0 and over, adult: Secondary | ICD-10-CM | POA: Insufficient documentation

## 2017-02-05 ENCOUNTER — Ambulatory Visit (INDEPENDENT_AMBULATORY_CARE_PROVIDER_SITE_OTHER): Payer: Medicaid Other | Admitting: Obstetrics and Gynecology

## 2017-02-05 VITALS — BP 106/72 | HR 101 | Wt 268.2 lb

## 2017-02-05 DIAGNOSIS — Z23 Encounter for immunization: Secondary | ICD-10-CM

## 2017-02-05 DIAGNOSIS — R7989 Other specified abnormal findings of blood chemistry: Secondary | ICD-10-CM

## 2017-02-05 DIAGNOSIS — O10912 Unspecified pre-existing hypertension complicating pregnancy, second trimester: Secondary | ICD-10-CM

## 2017-02-05 DIAGNOSIS — O0990 Supervision of high risk pregnancy, unspecified, unspecified trimester: Secondary | ICD-10-CM

## 2017-02-05 DIAGNOSIS — D649 Anemia, unspecified: Secondary | ICD-10-CM

## 2017-02-05 DIAGNOSIS — O99012 Anemia complicating pregnancy, second trimester: Secondary | ICD-10-CM

## 2017-02-05 DIAGNOSIS — O10919 Unspecified pre-existing hypertension complicating pregnancy, unspecified trimester: Secondary | ICD-10-CM

## 2017-02-05 DIAGNOSIS — O09899 Supervision of other high risk pregnancies, unspecified trimester: Secondary | ICD-10-CM

## 2017-02-05 MED ORDER — ASPIRIN EC 81 MG PO TBEC: 81.0000 mg | DELAYED_RELEASE_TABLET | Freq: Every day | ORAL | 2 refills | Status: DC

## 2017-02-05 NOTE — Progress Notes (Signed)
Pt presents for ROB c/o BH's. Tdap and Flu vaccine given today. Needs 2 gtt labs.

## 2017-02-05 NOTE — Patient Instructions (Signed)

## 2017-02-05 NOTE — Progress Notes (Signed)
Subjective:  Kristin Bentley is a 26 y.o. G2P1001 at 44w4dbeing seen today for ongoing prenatal care.  She is currently monitored for the following issues for this high-risk pregnancy and has Obesity; Asthma, chronic; Metabolic syndrome; History of attention deficit hyperactivity disorder (ADHD); Essential hypertension; HTN (hypertension); Supervision of high risk pregnancy, antepartum; Short interval between pregnancies affecting pregnancy, antepartum; Late prenatal care affecting pregnancy in second trimester; Low vitamin D level; Anemia affecting pregnancy; and Chronic hypertension during pregnancy, antepartum on her problem list.  Patient reports no complaints.  Contractions: Irregular. Vag. Bleeding: None.  Movement: Present. Denies leaking of fluid.   The following portions of the patient's history were reviewed and updated as appropriate: allergies, current medications, past family history, past medical history, past social history, past surgical history and problem list. Problem list updated.  Objective:   Vitals:   02/05/17 1349  BP: 106/72  Pulse: (!) 101  Weight: 268 lb 3.2 oz (121.7 kg)    Fetal Status: Fetal Heart Rate (bpm): 145   Movement: Present     General:  Alert, oriented and cooperative. Patient is in no acute distress.  Skin: Skin is warm and dry. No rash noted.   Cardiovascular: Normal heart rate noted  Respiratory: Normal respiratory effort, no problems with respiration noted  Abdomen: Soft, gravid, appropriate for gestational age. Pain/Pressure: Present     Pelvic:  Cervical exam deferred        Extremities: Normal range of motion.  Edema: None  Mental Status: Normal mood and affect. Normal behavior. Normal judgment and thought content.   Urinalysis:      Assessment and Plan:  Pregnancy: G2P1001 at 24w4d1. Supervision of high risk pregnancy, antepartum  - Flu Vaccine QUAD 36+ mos IM (Fluarix, Quad PF) Has been referred to Pulmonary, they were unable  to reach pt. Pt given number to call and schedule appt. Continue with Proair PRN and Flovent bid Schedule Glucola this week 2. Short interval between pregnancies affecting pregnancy, antepartum   3. Low vitamin D level Taking supplement  4. Anemia affecting pregnancy in second trimester Taking iron  5. Chronic hypertension during pregnancy, antepartum No meds prior to pregnancy - CBC - Comp Met (CMET) - Protein / creatinine ratio, urine - aspirin EC 81 MG tablet; Take 1 tablet (81 mg total) by mouth daily. Take after 12 weeks for prevention of preeclampsia later in pregnancy  Dispense: 300 tablet; Refill: 2  Preterm labor symptoms and general obstetric precautions including but not limited to vaginal bleeding, contractions, leaking of fluid and fetal movement were reviewed in detail with the patient. Please refer to After Visit Summary for other counseling recommendations.  No Follow-up on file.   MiChancy MilroyMD

## 2017-02-06 LAB — COMPREHENSIVE METABOLIC PANEL WITH GFR
ALT: 12 IU/L (ref 0–32)
AST: 13 IU/L (ref 0–40)
Albumin/Globulin Ratio: 1.1 — ABNORMAL LOW (ref 1.2–2.2)
Albumin: 3.4 g/dL — ABNORMAL LOW (ref 3.5–5.5)
Alkaline Phosphatase: 94 IU/L (ref 39–117)
BUN/Creatinine Ratio: 13 (ref 9–23)
BUN: 8 mg/dL (ref 6–20)
Bilirubin Total: <0.2 mg/dL (ref 0.0–1.2)
CO2: 23 mmol/L (ref 18–29)
Calcium: 9 mg/dL (ref 8.7–10.2)
Chloride: 100 mmol/L (ref 96–106)
Creatinine, Ser: 0.63 mg/dL (ref 0.57–1.00)
GFR calc Af Amer: 146 mL/min/1.73
GFR calc non Af Amer: 127 mL/min/1.73
Globulin, Total: 3.1 g/dL (ref 1.5–4.5)
Glucose: 81 mg/dL (ref 65–99)
Potassium: 3.9 mmol/L (ref 3.5–5.2)
Sodium: 137 mmol/L (ref 134–144)
Total Protein: 6.5 g/dL (ref 6.0–8.5)

## 2017-02-06 LAB — CBC
Hematocrit: 29.1 % — ABNORMAL LOW (ref 34.0–46.6)
Hemoglobin: 8.6 g/dL — ABNORMAL LOW (ref 11.1–15.9)
MCH: 18.1 pg — AB (ref 26.6–33.0)
MCHC: 29.6 g/dL — ABNORMAL LOW (ref 31.5–35.7)
MCV: 61 fL — AB (ref 79–97)
PLATELETS: 304 10*3/uL (ref 150–379)
RBC: 4.75 x10E6/uL (ref 3.77–5.28)
RDW: 19.9 % — AB (ref 12.3–15.4)
WBC: 7 10*3/uL (ref 3.4–10.8)

## 2017-02-06 LAB — PROTEIN / CREATININE RATIO, URINE
Creatinine, Urine: 201 mg/dL
Protein, Ur: 17.3 mg/dL
Protein/Creat Ratio: 86 mg/g{creat} (ref 0–200)

## 2017-02-07 ENCOUNTER — Other Ambulatory Visit: Payer: Medicaid Other

## 2017-02-15 ENCOUNTER — Inpatient Hospital Stay (HOSPITAL_COMMUNITY): Payer: Medicaid Other

## 2017-02-15 ENCOUNTER — Inpatient Hospital Stay (HOSPITAL_COMMUNITY)
Admission: AD | Admit: 2017-02-15 | Discharge: 2017-02-15 | Disposition: A | Discharge status: Home / Self Care | Payer: Medicaid Other | Source: Ambulatory Visit | Attending: Family Medicine | Admitting: Family Medicine

## 2017-02-15 ENCOUNTER — Encounter (HOSPITAL_COMMUNITY): Payer: Self-pay | Admitting: *Deleted

## 2017-02-15 DIAGNOSIS — Z825 Family history of asthma and other chronic lower respiratory diseases: Secondary | ICD-10-CM | POA: Diagnosis not present

## 2017-02-15 DIAGNOSIS — O9989 Other specified diseases and conditions complicating pregnancy, childbirth and the puerperium: Secondary | ICD-10-CM | POA: Diagnosis not present

## 2017-02-15 DIAGNOSIS — Z87891 Personal history of nicotine dependence: Secondary | ICD-10-CM | POA: Insufficient documentation

## 2017-02-15 DIAGNOSIS — W19XXXA Unspecified fall, initial encounter: Secondary | ICD-10-CM | POA: Diagnosis not present

## 2017-02-15 DIAGNOSIS — Z8249 Family history of ischemic heart disease and other diseases of the circulatory system: Secondary | ICD-10-CM | POA: Diagnosis not present

## 2017-02-15 DIAGNOSIS — O99013 Anemia complicating pregnancy, third trimester: Secondary | ICD-10-CM | POA: Insufficient documentation

## 2017-02-15 DIAGNOSIS — O26893 Other specified pregnancy related conditions, third trimester: Secondary | ICD-10-CM | POA: Diagnosis not present

## 2017-02-15 DIAGNOSIS — Z833 Family history of diabetes mellitus: Secondary | ICD-10-CM | POA: Diagnosis not present

## 2017-02-15 DIAGNOSIS — R55 Syncope and collapse: Secondary | ICD-10-CM | POA: Diagnosis present

## 2017-02-15 DIAGNOSIS — O9A213 Injury, poisoning and certain other consequences of external causes complicating pregnancy, third trimester: Secondary | ICD-10-CM | POA: Diagnosis not present

## 2017-02-15 DIAGNOSIS — S0990XA Unspecified injury of head, initial encounter: Secondary | ICD-10-CM | POA: Diagnosis not present

## 2017-02-15 DIAGNOSIS — Z3A29 29 weeks gestation of pregnancy: Secondary | ICD-10-CM | POA: Insufficient documentation

## 2017-02-15 HISTORY — DX: Other specified anxiety disorders: F41.8

## 2017-02-15 LAB — RAPID URINE DRUG SCREEN, HOSP PERFORMED
Amphetamines: NOT DETECTED
BARBITURATES: NOT DETECTED
BENZODIAZEPINES: NOT DETECTED
Cocaine: NOT DETECTED
Opiates: NOT DETECTED
TETRAHYDROCANNABINOL: NOT DETECTED

## 2017-02-15 LAB — CBC
HCT: 30.1 % — ABNORMAL LOW (ref 36.0–46.0)
Hemoglobin: 8.8 g/dL — ABNORMAL LOW (ref 12.0–15.0)
MCH: 18.8 pg — ABNORMAL LOW (ref 26.0–34.0)
MCHC: 29.2 g/dL — ABNORMAL LOW (ref 30.0–36.0)
MCV: 64.2 fL — ABNORMAL LOW (ref 78.0–100.0)
PLATELETS: 271 10*3/uL (ref 150–400)
RBC: 4.69 MIL/uL (ref 3.87–5.11)
RDW: 19.4 % — ABNORMAL HIGH (ref 11.5–15.5)
WBC: 7.9 10*3/uL (ref 4.0–10.5)

## 2017-02-15 LAB — COMPREHENSIVE METABOLIC PANEL
ALBUMIN: 3 g/dL — AB (ref 3.5–5.0)
ALT: 14 U/L (ref 14–54)
AST: 16 U/L (ref 15–41)
Alkaline Phosphatase: 77 U/L (ref 38–126)
Anion gap: 6 (ref 5–15)
BUN: 9 mg/dL (ref 6–20)
CHLORIDE: 103 mmol/L (ref 101–111)
CO2: 25 mmol/L (ref 22–32)
CREATININE: 0.57 mg/dL (ref 0.44–1.00)
Calcium: 9 mg/dL (ref 8.9–10.3)
GFR calc non Af Amer: >60 mL/min (ref >60)
GLUCOSE: 98 mg/dL (ref 65–99)
Potassium: 3.5 mmol/L (ref 3.5–5.1)
SODIUM: 134 mmol/L — AB (ref 135–145)
Total Bilirubin: 0.2 mg/dL — ABNORMAL LOW (ref 0.3–1.2)
Total Protein: 7 g/dL (ref 6.5–8.1)

## 2017-02-15 LAB — WET PREP, GENITAL
SPERM: NONE SEEN
TRICH WET PREP: NONE SEEN
YEAST WET PREP: NONE SEEN

## 2017-02-15 LAB — URINALYSIS, ROUTINE W REFLEX MICROSCOPIC
BILIRUBIN URINE: NEGATIVE
Glucose, UA: NEGATIVE mg/dL
Hgb urine dipstick: NEGATIVE
KETONES UR: NEGATIVE mg/dL
Nitrite: NEGATIVE
PROTEIN: 30 mg/dL — AB
Specific Gravity, Urine: 1.029 (ref 1.005–1.030)
pH: 5 (ref 5.0–8.0)

## 2017-02-15 LAB — FETAL FIBRONECTIN: Fetal Fibronectin: NEGATIVE

## 2017-02-15 NOTE — Discharge Instructions (Signed)
Call [facility] Social Services with questions about Medicaid or Medicaid case worker: [phone] [facility])

## 2017-02-15 NOTE — Progress Notes (Signed)
Left message with social worker voicemail and paged [phone] S.W. Waiting for returned call.

## 2017-02-15 NOTE — Progress Notes (Signed)
Message left for social worker to call patient at earliest convenience regarding her questions about changing her guarantor status.
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
## 2017-02-17 LAB — CULTURE, OB URINE

## 2017-02-18 LAB — GC/CHLAMYDIA PROBE AMP (~~LOC~~) NOT AT ARMC
CHLAMYDIA, DNA PROBE: NEGATIVE
NEISSERIA GONORRHEA: NEGATIVE

## 2017-02-22 ENCOUNTER — Ambulatory Visit (HOSPITAL_COMMUNITY)
Admission: RE | Admit: 2017-02-22 | Discharge: 2017-02-22 | Disposition: A | Discharge status: Home / Self Care | Payer: Medicaid Other | Source: Ambulatory Visit | Attending: Certified Nurse Midwife | Admitting: Certified Nurse Midwife

## 2017-02-22 DIAGNOSIS — R6252 Short stature (child): Secondary | ICD-10-CM

## 2017-03-05 ENCOUNTER — Ambulatory Visit (INDEPENDENT_AMBULATORY_CARE_PROVIDER_SITE_OTHER): Payer: Medicaid Other | Admitting: Obstetrics and Gynecology

## 2017-03-05 ENCOUNTER — Ambulatory Visit (HOSPITAL_COMMUNITY)
Admission: RE | Admit: 2017-03-05 | Discharge: 2017-03-05 | Disposition: A | Discharge status: Home / Self Care | Payer: Medicaid Other | Source: Ambulatory Visit | Attending: Certified Nurse Midwife | Admitting: Certified Nurse Midwife

## 2017-03-05 VITALS — BP 111/72 | HR 91 | Wt 267.0 lb

## 2017-03-05 DIAGNOSIS — Z362 Encounter for other antenatal screening follow-up: Secondary | ICD-10-CM | POA: Diagnosis present

## 2017-03-05 DIAGNOSIS — O99213 Obesity complicating pregnancy, third trimester: Secondary | ICD-10-CM | POA: Diagnosis not present

## 2017-03-05 DIAGNOSIS — Z3A34 34 weeks gestation of pregnancy: Secondary | ICD-10-CM | POA: Insufficient documentation

## 2017-03-05 DIAGNOSIS — O10013 Pre-existing essential hypertension complicating pregnancy, third trimester: Secondary | ICD-10-CM | POA: Diagnosis present

## 2017-03-05 DIAGNOSIS — O09899 Supervision of other high risk pregnancies, unspecified trimester: Secondary | ICD-10-CM

## 2017-03-05 DIAGNOSIS — O0933 Supervision of pregnancy with insufficient antenatal care, third trimester: Secondary | ICD-10-CM

## 2017-03-05 DIAGNOSIS — O0932 Supervision of pregnancy with insufficient antenatal care, second trimester: Secondary | ICD-10-CM

## 2017-03-05 DIAGNOSIS — O0993 Supervision of high risk pregnancy, unspecified, third trimester: Secondary | ICD-10-CM

## 2017-03-05 DIAGNOSIS — O10919 Unspecified pre-existing hypertension complicating pregnancy, unspecified trimester: Secondary | ICD-10-CM

## 2017-03-05 DIAGNOSIS — O0990 Supervision of high risk pregnancy, unspecified, unspecified trimester: Secondary | ICD-10-CM

## 2017-03-05 NOTE — Progress Notes (Signed)
   PRENATAL VISIT NOTE  Subjective:  Phoebe SharpsKhadijah Hill is a 26 y.o. G2P1001 at 7079w4d being seen today for ongoing prenatal care.  She is currently monitored for the following issues for this high-risk pregnancy and has Obesity; Asthma, chronic; Metabolic syndrome; History of attention deficit hyperactivity disorder (ADHD); Essential hypertension; HTN (hypertension); Supervision of high risk pregnancy, antepartum; Short interval between pregnancies affecting pregnancy, antepartum; Late prenatal care affecting pregnancy in second trimester; Low vitamin D level; Anemia affecting pregnancy; and Chronic hypertension during pregnancy, antepartum on her problem list.  Patient reports no complaints.  Contractions: Irregular. Vag. Bleeding: None.  Movement: Present. Denies leaking of fluid.   The following portions of the patient's history were reviewed and updated as appropriate: allergies, current medications, past family history, past medical history, past social history, past surgical history and problem list. Problem list updated.  Objective:   Vitals:   03/05/17 1309  BP: 111/72  Pulse: 91  Weight: 267 lb (121.1 kg)    Fetal Status: Fetal Heart Rate (bpm): 140 (Simultaneous filing. User may not have seen previous data.) Fundal Height: 32 cm Movement: Present     General:  Alert, oriented and cooperative. Patient is in no acute distress.  Skin: Skin is warm and dry. No rash noted.   Cardiovascular: Normal heart rate noted  Respiratory: Normal respiratory effort, no problems with respiration noted  Abdomen: Soft, gravid, appropriate for gestational age. Pain/Pressure: Present     Pelvic:  Cervical exam deferred        Extremities: Normal range of motion.  Edema: None  Mental Status: Normal mood and affect. Normal behavior. Normal judgment and thought content.   Assessment and Plan:  Pregnancy: G2P1001 at 2679w4d  1. Chronic hypertension during pregnancy, antepartum BP well controlled  without med Continue ASA Follow up growth ultrasound today Start twice weekly fetal testing next visit  2. Supervision of high risk pregnancy, antepartum Patient is doing well without complaints Third trimester labs today- will do 1 hr glucola - Glucose tolerance, 1 hour - CBC - RPR - HIV antibody  3. Late prenatal care affecting pregnancy in second trimester   4. Short interval between pregnancies affecting pregnancy, antepartum   Preterm labor symptoms and general obstetric precautions including but not limited to vaginal bleeding, contractions, leaking of fluid and fetal movement were reviewed in detail with the patient. Please refer to After Visit Summary for other counseling recommendations.  No Follow-up on file.   Lenetta Piche, Gigi GinPeggy, MD

## 2017-03-06 ENCOUNTER — Other Ambulatory Visit (HOSPITAL_COMMUNITY): Payer: Self-pay | Admitting: *Deleted

## 2017-03-06 DIAGNOSIS — O10919 Unspecified pre-existing hypertension complicating pregnancy, unspecified trimester: Secondary | ICD-10-CM

## 2017-03-06 LAB — CBC
HEMATOCRIT: 30.4 % — AB (ref 34.0–46.6)
HEMOGLOBIN: 8.9 g/dL — AB (ref 11.1–15.9)
MCH: 18.7 pg — ABNORMAL LOW (ref 26.6–33.0)
MCHC: 29.3 g/dL — ABNORMAL LOW (ref 31.5–35.7)
MCV: 64 fL — ABNORMAL LOW (ref 79–97)
Platelets: 299 10*3/uL (ref 150–379)
RBC: 4.77 x10E6/uL (ref 3.77–5.28)
RDW: 19.7 % — AB (ref 12.3–15.4)
WBC: 7.1 10*3/uL (ref 3.4–10.8)

## 2017-03-06 LAB — RPR: RPR: NONREACTIVE

## 2017-03-06 LAB — HIV ANTIBODY (ROUTINE TESTING W REFLEX): HIV SCREEN 4TH GENERATION: NONREACTIVE

## 2017-03-06 LAB — GLUCOSE TOLERANCE, 1 HOUR: GLUCOSE, 1HR PP: 91 mg/dL (ref 65–199)

## 2017-03-07 ENCOUNTER — Other Ambulatory Visit: Payer: Self-pay | Admitting: Certified Nurse Midwife

## 2017-03-18 ENCOUNTER — Other Ambulatory Visit: Payer: Medicaid Other

## 2017-03-18 ENCOUNTER — Encounter: Payer: Medicaid Other | Admitting: Obstetrics and Gynecology

## 2017-04-02 ENCOUNTER — Ambulatory Visit (HOSPITAL_COMMUNITY)
Admission: RE | Admit: 2017-04-02 | Discharge: 2017-04-02 | Disposition: A | Discharge status: Home / Self Care | Payer: Medicaid Other | Source: Ambulatory Visit | Attending: Certified Nurse Midwife | Admitting: Certified Nurse Midwife

## 2017-04-02 ENCOUNTER — Encounter (HOSPITAL_COMMUNITY): Payer: Self-pay

## 2017-04-02 DIAGNOSIS — O10013 Pre-existing essential hypertension complicating pregnancy, third trimester: Secondary | ICD-10-CM | POA: Insufficient documentation

## 2017-04-02 DIAGNOSIS — Z3A35 35 weeks gestation of pregnancy: Secondary | ICD-10-CM | POA: Diagnosis not present

## 2017-04-02 DIAGNOSIS — O10919 Unspecified pre-existing hypertension complicating pregnancy, unspecified trimester: Secondary | ICD-10-CM

## 2017-04-04 ENCOUNTER — Encounter: Payer: Self-pay | Admitting: Obstetrics & Gynecology

## 2017-04-04 ENCOUNTER — Ambulatory Visit (INDEPENDENT_AMBULATORY_CARE_PROVIDER_SITE_OTHER): Payer: Medicaid Other | Admitting: Obstetrics & Gynecology

## 2017-04-04 VITALS — BP 120/83 | HR 98 | Wt 270.0 lb

## 2017-04-04 DIAGNOSIS — O10919 Unspecified pre-existing hypertension complicating pregnancy, unspecified trimester: Secondary | ICD-10-CM

## 2017-04-04 DIAGNOSIS — O10913 Unspecified pre-existing hypertension complicating pregnancy, third trimester: Secondary | ICD-10-CM

## 2017-04-04 DIAGNOSIS — O0990 Supervision of high risk pregnancy, unspecified, unspecified trimester: Secondary | ICD-10-CM

## 2017-04-04 DIAGNOSIS — O0993 Supervision of high risk pregnancy, unspecified, third trimester: Secondary | ICD-10-CM

## 2017-04-04 NOTE — Patient Instructions (Signed)
Third Trimester of Pregnancy The third trimester is from week 28 through week 40 (months 7 through 9). The third trimester is a time when the unborn baby (fetus) is growing rapidly. At the end of the ninth month, the fetus is about 20 inches in length and weighs 6-10 pounds. Body changes during your third trimester Your body will continue to go through many changes during pregnancy. The changes vary from woman to woman. During the third trimester:  Your weight will continue to increase. You can expect to gain 25-35 pounds (11-16 kg) by the end of the pregnancy.  You may begin to get stretch marks on your hips, abdomen, and breasts.  You may urinate more often because the fetus is moving lower into your pelvis and pressing on your bladder.  You may develop or continue to have heartburn. This is caused by increased hormones that slow down muscles in the digestive tract.  You may develop or continue to have constipation because increased hormones slow digestion and cause the muscles that push waste through your intestines to relax.  You may develop hemorrhoids. These are swollen veins (varicose veins) in the rectum that can itch or be painful.  You may develop swollen, bulging veins (varicose veins) in your legs.  You may have increased body aches in the pelvis, back, or thighs. This is due to weight gain and increased hormones that are relaxing your joints.  You may have changes in your hair. These can include thickening of your hair, rapid growth, and changes in texture. Some women also have hair loss during or after pregnancy, or hair that feels dry or thin. Your hair will most likely return to normal after your baby is born.  Your breasts will continue to grow and they will continue to become tender. A yellow fluid (colostrum) may leak from your breasts. This is the first milk you are producing for your baby.  Your belly button may stick out.  You may notice more swelling in your hands,  face, or ankles.  You may have increased tingling or numbness in your hands, arms, and legs. The skin on your belly may also feel numb.  You may feel short of breath because of your expanding uterus.  You may have more problems sleeping. This can be caused by the size of your belly, increased need to urinate, and an increase in your body's metabolism.  You may notice the fetus "dropping," or moving lower in your abdomen (lightening).  You may have increased vaginal discharge.  You may notice your joints feel loose and you may have pain around your pelvic bone.  What to expect at prenatal visits You will have prenatal exams every 2 weeks until week 36. Then you will have weekly prenatal exams. During a routine prenatal visit:  You will be weighed to make sure you and the baby are growing normally.  Your blood pressure will be taken.  Your abdomen will be measured to track your baby's growth.  The fetal heartbeat will be listened to.  Any test results from the previous visit will be discussed.  You may have a cervical check near your due date to see if your cervix has softened or thinned (effaced).  You will be tested for Group B streptococcus. This happens between 35 and 37 weeks.  Your health care provider may ask you:  What your birth plan is.  How you are feeling.  If you are feeling the baby move.  If you have had   any abnormal symptoms, such as leaking fluid, bleeding, severe headaches, or abdominal cramping.  If you are using any tobacco products, including cigarettes, chewing tobacco, and electronic cigarettes.  If you have any questions.  Other tests or screenings that may be performed during your third trimester include:  Blood tests that check for low iron levels (anemia).  Fetal testing to check the health, activity level, and growth of the fetus. Testing is done if you have certain medical conditions or if there are problems during the  pregnancy.  Nonstress test (NST). This test checks the health of your baby to make sure there are no signs of problems, such as the baby not getting enough oxygen. During this test, a belt is placed around your belly. The baby is made to move, and its heart rate is monitored during movement.  What is false labor? False labor is a condition in which you feel small, irregular tightenings of the muscles in the womb (contractions) that usually go away with rest, changing position, or drinking water. These are called Braxton Hicks contractions. Contractions may last for hours, days, or even weeks before true labor sets in. If contractions come at regular intervals, become more frequent, increase in intensity, or become painful, you should see your health care provider. What are the signs of labor?  Abdominal cramps.  Regular contractions that start at 10 minutes apart and become stronger and more frequent with time.  Contractions that start on the top of the uterus and spread down to the lower abdomen and back.  Increased pelvic pressure and dull back pain.  A watery or bloody mucus discharge that comes from the vagina.  Leaking of amniotic fluid. This is also known as your "water breaking." It could be a slow trickle or a gush. Let your health care provider know if it has a color or strange odor. If you have any of these signs, call your health care provider right away, even if it is before your due date. Follow these instructions at home: Medicines  Follow your health care provider's instructions regarding medicine use. Specific medicines may be either safe or unsafe to take during pregnancy.  Take a prenatal vitamin that contains at least 600 micrograms (mcg) of folic acid.  If you develop constipation, try taking a stool softener if your health care provider approves. Eating and drinking  Eat a balanced diet that includes fresh fruits and vegetables, whole grains, good sources of protein  such as meat, eggs, or tofu, and low-fat dairy. Your health care provider will help you determine the amount of weight gain that is right for you.  Avoid raw meat and uncooked cheese. These carry germs that can cause birth defects in the baby.  If you have low calcium intake from food, talk to your health care provider about whether you should take a daily calcium supplement.  Eat four or five small meals rather than three large meals a day.  Limit foods that are high in fat and processed sugars, such as fried and sweet foods.  To prevent constipation: ? Drink enough fluid to keep your urine clear or pale yellow. ? Eat foods that are high in fiber, such as fresh fruits and vegetables, whole grains, and beans. Activity  Exercise only as directed by your health care provider. Most women can continue their usual exercise routine during pregnancy. Try to exercise for 30 minutes at least 5 days a week. Stop exercising if you experience uterine contractions.  Avoid heavy   lifting.  Do not exercise in extreme heat or humidity, or at high altitudes.  Wear low-heel, comfortable shoes.  Practice good posture.  You may continue to have sex unless your health care provider tells you otherwise. Relieving pain and discomfort  Take frequent breaks and rest with your legs elevated if you have leg cramps or low back pain.  Take warm sitz baths to soothe any pain or discomfort caused by hemorrhoids. Use hemorrhoid cream if your health care provider approves.  Wear a good support bra to prevent discomfort from breast tenderness.  If you develop varicose veins: ? Wear support pantyhose or compression stockings as told by your healthcare provider. ? Elevate your feet for 15 minutes, 3-4 times a day. Prenatal care  Write down your questions. Take them to your prenatal visits.  Keep all your prenatal visits as told by your health care provider. This is important. Safety  Wear your seat belt at  all times when driving.  Make a list of emergency phone numbers, including numbers for family, friends, the hospital, and police and fire departments. General instructions  Avoid cat litter boxes and soil used by cats. These carry germs that can cause birth defects in the baby. If you have a cat, ask someone to clean the litter box for you.  Do not travel far distances unless it is absolutely necessary and only with the approval of your health care provider.  Do not use hot tubs, steam rooms, or saunas.  Do not drink alcohol.  Do not use any products that contain nicotine or tobacco, such as cigarettes and e-cigarettes. If you need help quitting, ask your health care provider.  Do not use any medicinal herbs or unprescribed drugs. These chemicals affect the formation and growth of the baby.  Do not douche or use tampons or scented sanitary pads.  Do not cross your legs for long periods of time.  To prepare for the arrival of your baby: ? Take prenatal classes to understand, practice, and ask questions about labor and delivery. ? Make a trial run to the hospital. ? Visit the hospital and tour the maternity area. ? Arrange for maternity or paternity leave through employers. ? Arrange for family and friends to take care of pets while you are in the hospital. ? Purchase a rear-facing car seat and make sure you know how to install it in your car. ? Pack your hospital bag. ? Prepare the baby's nursery. Make sure to remove all pillows and stuffed animals from the baby's crib to prevent suffocation.  Visit your dentist if you have not gone during your pregnancy. Use a soft toothbrush to brush your teeth and be gentle when you floss. Contact a health care provider if:  You are unsure if you are in labor or if your water has broken.  You become dizzy.  You have mild pelvic cramps, pelvic pressure, or nagging pain in your abdominal area.  You have lower back pain.  You have persistent  nausea, vomiting, or diarrhea.  You have an unusual or bad smelling vaginal discharge.  You have pain when you urinate. Get help right away if:  Your water breaks before 37 weeks.  You have regular contractions less than 5 minutes apart before 37 weeks.  You have a fever.  You are leaking fluid from your vagina.  You have spotting or bleeding from your vagina.  You have severe abdominal pain or cramping.  You have rapid weight loss or weight gain.    You have shortness of breath with chest pain.  You notice sudden or extreme swelling of your face, hands, ankles, feet, or legs.  Your baby makes fewer than 10 movements in 2 hours.  You have severe headaches that do not go away when you take medicine.  You have vision changes. Summary  The third trimester is from week 28 through week 40, months 7 through 9. The third trimester is a time when the unborn baby (fetus) is growing rapidly.  During the third trimester, your discomfort may increase as you and your baby continue to gain weight. You may have abdominal, leg, and back pain, sleeping problems, and an increased need to urinate.  During the third trimester your breasts will keep growing and they will continue to become tender. A yellow fluid (colostrum) may leak from your breasts. This is the first milk you are producing for your baby.  False labor is a condition in which you feel small, irregular tightenings of the muscles in the womb (contractions) that eventually go away. These are called Braxton Hicks contractions. Contractions may last for hours, days, or even weeks before true labor sets in.  Signs of labor can include: abdominal cramps; regular contractions that start at 10 minutes apart and become stronger and more frequent with time; watery or bloody mucus discharge that comes from the vagina; increased pelvic pressure and dull back pain; and leaking of amniotic fluid. This information is not intended to replace advice  given to you by your health care provider. Make sure you discuss any questions you have with your health care provider. Document Released: 10/09/2001 Document Revised: 03/22/2016 Document Reviewed: 12/16/2012 Elsevier Interactive Patient Education  2017 [employer]  

## 2017-04-04 NOTE — Progress Notes (Signed)
   PRENATAL VISIT NOTE  Subjective:  Kristin SharpsKhadijah Bentley is a 26 y.o. G2P1001 at 854w6d being seen today for ongoing prenatal care.  She is currently monitored for the following issues for this high-risk pregnancy and has Obesity; Asthma, chronic; Metabolic syndrome; History of attention deficit hyperactivity disorder (ADHD); Essential hypertension; HTN (hypertension); Supervision of high risk pregnancy, antepartum; Short interval between pregnancies affecting pregnancy, antepartum; Late prenatal care affecting pregnancy in second trimester; Low vitamin D level; Anemia affecting pregnancy; and Chronic hypertension during pregnancy, antepartum on her problem list.  Patient reports no complaints.  Contractions: Irregular. Vag. Bleeding: None.  Movement: Present. Denies leaking of fluid.   The following portions of the patient's history were reviewed and updated as appropriate: allergies, current medications, past family history, past medical history, past social history, past surgical history and problem list. Problem list updated.  Objective:   Vitals:   04/04/17 1327  BP: 120/83  Pulse: 98  Weight: 270 lb (122.5 kg)    Fetal Status:     Movement: Present     General:  Alert, oriented and cooperative. Patient is in no acute distress.  Skin: Skin is warm and dry. No rash noted.   Cardiovascular: Normal heart rate noted  Respiratory: Normal respiratory effort, no problems with respiration noted  Abdomen: Soft, gravid, appropriate for gestational age. Pain/Pressure: Present     Pelvic:  Cervical exam deferred        Extremities: Normal range of motion.     Mental Status: Normal mood and affect. Normal behavior. Normal judgment and thought content.   Assessment and Plan:  Pregnancy: G2P1001 at 754w6d  1. Supervision of high risk pregnancy, antepartum h/o CHTN on no meds for years  2. Chronic hypertension during pregnancy, antepartum Nl BP today, NST reactive  Preterm labor symptoms  and general obstetric precautions including but not limited to vaginal bleeding, contractions, leaking of fluid and fetal movement were reviewed in detail with the patient. Please refer to After Visit Summary for other counseling recommendations.  Return in about 4 days (around 04/08/2017) for NST and GBS.   Scheryl DarterJames Arnold, MD

## 2017-04-04 NOTE — Progress Notes (Signed)
Pt had u/s on 04/02/17

## 2017-04-05 ENCOUNTER — Other Ambulatory Visit: Payer: Self-pay | Admitting: Certified Nurse Midwife

## 2017-04-09 ENCOUNTER — Other Ambulatory Visit: Payer: Self-pay | Admitting: Obstetrics & Gynecology

## 2017-04-09 ENCOUNTER — Encounter (HOSPITAL_COMMUNITY): Payer: Self-pay

## 2017-04-09 ENCOUNTER — Other Ambulatory Visit (HOSPITAL_COMMUNITY)
Admission: RE | Admit: 2017-04-09 | Discharge: 2017-04-09 | Disposition: A | Discharge status: Home / Self Care | Payer: Medicaid Other | Source: Ambulatory Visit | Attending: Obstetrics & Gynecology | Admitting: Obstetrics & Gynecology

## 2017-04-09 ENCOUNTER — Ambulatory Visit (HOSPITAL_COMMUNITY)
Admission: RE | Admit: 2017-04-09 | Discharge: 2017-04-09 | Disposition: A | Discharge status: Home / Self Care | Payer: Medicaid Other | Source: Ambulatory Visit | Attending: Obstetrics & Gynecology | Admitting: Obstetrics & Gynecology

## 2017-04-09 ENCOUNTER — Ambulatory Visit (INDEPENDENT_AMBULATORY_CARE_PROVIDER_SITE_OTHER): Payer: Medicaid Other | Admitting: Obstetrics & Gynecology

## 2017-04-09 VITALS — BP 114/76 | HR 90 | Wt 270.0 lb

## 2017-04-09 DIAGNOSIS — O0993 Supervision of high risk pregnancy, unspecified, third trimester: Secondary | ICD-10-CM | POA: Diagnosis not present

## 2017-04-09 DIAGNOSIS — O99213 Obesity complicating pregnancy, third trimester: Secondary | ICD-10-CM | POA: Insufficient documentation

## 2017-04-09 DIAGNOSIS — E6609 Other obesity due to excess calories: Secondary | ICD-10-CM

## 2017-04-09 DIAGNOSIS — Z6841 Body Mass Index (BMI) 40.0 and over, adult: Secondary | ICD-10-CM | POA: Diagnosis not present

## 2017-04-09 DIAGNOSIS — O0932 Supervision of pregnancy with insufficient antenatal care, second trimester: Secondary | ICD-10-CM

## 2017-04-09 DIAGNOSIS — I10 Essential (primary) hypertension: Secondary | ICD-10-CM

## 2017-04-09 DIAGNOSIS — E669 Obesity, unspecified: Secondary | ICD-10-CM | POA: Insufficient documentation

## 2017-04-09 DIAGNOSIS — O10919 Unspecified pre-existing hypertension complicating pregnancy, unspecified trimester: Secondary | ICD-10-CM

## 2017-04-09 DIAGNOSIS — O288 Other abnormal findings on antenatal screening of mother: Secondary | ICD-10-CM | POA: Diagnosis not present

## 2017-04-09 DIAGNOSIS — O10913 Unspecified pre-existing hypertension complicating pregnancy, third trimester: Secondary | ICD-10-CM | POA: Insufficient documentation

## 2017-04-09 DIAGNOSIS — Z3A36 36 weeks gestation of pregnancy: Secondary | ICD-10-CM | POA: Diagnosis not present

## 2017-04-09 DIAGNOSIS — O289 Unspecified abnormal findings on antenatal screening of mother: Secondary | ICD-10-CM

## 2017-04-09 DIAGNOSIS — Z6836 Body mass index (BMI) 36.0-36.9, adult: Secondary | ICD-10-CM

## 2017-04-09 DIAGNOSIS — O0933 Supervision of pregnancy with insufficient antenatal care, third trimester: Secondary | ICD-10-CM

## 2017-04-09 DIAGNOSIS — O0990 Supervision of high risk pregnancy, unspecified, unspecified trimester: Secondary | ICD-10-CM

## 2017-04-09 NOTE — Addendum Note (Signed)
Addended by: Dalphine HandingGARDNER, Hargis Vandyne L on: 04/09/2017 02:49 PM   Modules accepted: Orders

## 2017-04-09 NOTE — Progress Notes (Signed)
ROB pt presents for NST, GBS, and GC/CT. Pt c/o acid reflux.

## 2017-04-09 NOTE — Addendum Note (Signed)
Addended by: Dalphine HandingGARDNER, Tyjai Charbonnet L on: 04/09/2017 02:36 PM   Modules accepted: Orders

## 2017-04-09 NOTE — Progress Notes (Signed)
   PRENATAL VISIT NOTE  Subjective:  Phoebe SharpsKhadijah Lybbert is a 26 y.o. G2P1001 at 3472w4d being seen today for ongoing prenatal care.  She is currently monitored for the following issues for this high-risk pregnancy and has Obesity; Asthma, chronic; Metabolic syndrome; History of attention deficit hyperactivity disorder (ADHD); Essential hypertension; HTN (hypertension); Supervision of high risk pregnancy, antepartum; Short interval between pregnancies affecting pregnancy, antepartum; Late prenatal care affecting pregnancy in second trimester; Low vitamin D level; Anemia affecting pregnancy; and Chronic hypertension during pregnancy, antepartum on her problem list.  Patient reports no complaints.  Contractions: Irregular. Vag. Bleeding: None.  Movement: Present. Denies leaking of fluid.   The following portions of the patient's history were reviewed and updated as appropriate: allergies, current medications, past family history, past medical history, past social history, past surgical history and problem list. Problem list updated.  Objective:   Vitals:   04/09/17 1334  BP: 114/76  Pulse: 90  Weight: 270 lb (122.5 kg)    Fetal Status: Fetal Heart Rate (bpm): NST    Movement: Present     General:  Alert, oriented and cooperative. Patient is in no acute distress.  Skin: Skin is warm and dry. No rash noted.   Cardiovascular: Normal heart rate noted  Respiratory: Normal respiratory effort, no problems with respiration noted  Abdomen: Soft, gravid, appropriate for gestational age. Pain/Pressure: Present     Pelvic:  Cervical exam performed       1/th/high  Extremities: Normal range of motion.  Edema: None  Mental Status: Normal mood and affect. Normal behavior. Normal judgment and thought content.   Assessment and Plan:  Pregnancy: G2P1001 at 6072w4d  1. Supervision of high risk pregnancy, antepartum  - Strep Gp B NAA - Urine cytology ancillary only  2. Chronic hypertension during  pregnancy, antepartum  - Fetal nonstress test - deceleration on NST. She will go to the hospital for BPP  3. Class 2 obesity due to excess calories without serious comorbidity with body mass index (BMI) of 36.0 to 36.9 in adult   4. Essential hypertension   5. Late prenatal care affecting pregnancy in second trimester   Preterm labor symptoms and general obstetric precautions including but not limited to vaginal bleeding, contractions, leaking of fluid and fetal movement were reviewed in detail with the patient. Please refer to After Visit Summary for other counseling recommendations.  No Follow-up on file.   Allie BossierMyra C Thayden Lemire, MD

## 2017-04-10 LAB — URINE CYTOLOGY ANCILLARY ONLY
CHLAMYDIA, DNA PROBE: POSITIVE — AB
Neisseria Gonorrhea: NEGATIVE

## 2017-04-11 LAB — STREP GP B NAA: STREP GROUP B AG: NEGATIVE

## 2017-04-12 ENCOUNTER — Ambulatory Visit (HOSPITAL_COMMUNITY): Payer: Medicaid Other

## 2017-04-15 ENCOUNTER — Other Ambulatory Visit: Payer: Self-pay | Admitting: Obstetrics and Gynecology

## 2017-04-15 ENCOUNTER — Encounter: Payer: Self-pay | Admitting: Obstetrics and Gynecology

## 2017-04-15 ENCOUNTER — Ambulatory Visit (INDEPENDENT_AMBULATORY_CARE_PROVIDER_SITE_OTHER): Payer: Medicaid Other | Admitting: Obstetrics and Gynecology

## 2017-04-15 ENCOUNTER — Ambulatory Visit (HOSPITAL_COMMUNITY)
Admission: RE | Admit: 2017-04-15 | Discharge: 2017-04-15 | Disposition: A | Discharge status: Home / Self Care | Payer: Medicaid Other | Source: Ambulatory Visit | Attending: Obstetrics and Gynecology | Admitting: Obstetrics and Gynecology

## 2017-04-15 VITALS — BP 128/85 | HR 98 | Wt 271.0 lb

## 2017-04-15 DIAGNOSIS — O0990 Supervision of high risk pregnancy, unspecified, unspecified trimester: Secondary | ICD-10-CM | POA: Diagnosis present

## 2017-04-15 DIAGNOSIS — O10919 Unspecified pre-existing hypertension complicating pregnancy, unspecified trimester: Secondary | ICD-10-CM

## 2017-04-15 DIAGNOSIS — Z3A37 37 weeks gestation of pregnancy: Secondary | ICD-10-CM

## 2017-04-15 DIAGNOSIS — O10913 Unspecified pre-existing hypertension complicating pregnancy, third trimester: Secondary | ICD-10-CM | POA: Diagnosis not present

## 2017-04-15 DIAGNOSIS — O0993 Supervision of high risk pregnancy, unspecified, third trimester: Secondary | ICD-10-CM

## 2017-04-15 DIAGNOSIS — O289 Unspecified abnormal findings on antenatal screening of mother: Secondary | ICD-10-CM | POA: Diagnosis not present

## 2017-04-15 DIAGNOSIS — O98813 Other maternal infectious and parasitic diseases complicating pregnancy, third trimester: Secondary | ICD-10-CM

## 2017-04-15 DIAGNOSIS — O99820 Streptococcus B carrier state complicating pregnancy: Secondary | ICD-10-CM

## 2017-04-15 DIAGNOSIS — A749 Chlamydial infection, unspecified: Secondary | ICD-10-CM

## 2017-04-15 MED ORDER — AZITHROMYCIN 500 MG PO TABS: 1000.0000 mg | ORAL_TABLET | Freq: Once | ORAL | 1 refills | Status: AC

## 2017-04-15 NOTE — Progress Notes (Signed)
   PRENATAL VISIT NOTE  Subjective:  Kristin Bentley is a 26 y.o. G2P1001 at 5788w3d being seen today for ongoing prenatal care.  She is currently monitored for the following issues for this high-risk pregnancy and has Obesity; Asthma, chronic; Metabolic syndrome; History of attention deficit hyperactivity disorder (ADHD); Essential hypertension; HTN (hypertension); Supervision of high risk pregnancy, antepartum; Short interval between pregnancies affecting pregnancy, antepartum; Late prenatal care affecting pregnancy in second trimester; Low vitamin D level; Anemia affecting pregnancy; Chronic hypertension during pregnancy, antepartum; and Chlamydia infection affecting pregnancy in third trimester, antepartum on her problem list.  Patient reports no complaints.  Contractions: Irregular. Vag. Bleeding: None.  Movement: Present. Denies leaking of fluid.   The following portions of the patient's history were reviewed and updated as appropriate: allergies, current medications, past family history, past medical history, past social history, past surgical history and problem list. Problem list updated.  Objective:   Vitals:   04/15/17 1340  BP: 128/85  Pulse: 98  Weight: 271 lb (122.9 kg)    Fetal Status: Fetal Heart Rate (bpm): NST   Movement: Present     General:  Alert, oriented and cooperative. Patient is in no acute distress.  Skin: Skin is warm and dry. No rash noted.   Cardiovascular: Normal heart rate noted  Respiratory: Normal respiratory effort, no problems with respiration noted  Abdomen: Soft, gravid, appropriate for gestational age. Pain/Pressure: Present     Pelvic:  Cervical exam deferred        Extremities: Normal range of motion.  Edema: Trace  Mental Status: Normal mood and affect. Normal behavior. Normal judgment and thought content.   Assessment and Plan:  Pregnancy: G2P1001 at 5088w3d  1. Supervision of high risk pregnancy, antepartum Patient is doing well without  complaints She plans Nexplanon for contraception - Fetal nonstress test reviewed and category 2 with baseline 150, min-mod variability, + acces x1 with 3 prolonged decels. Patient sent for STAT BPP - US MFM FETAL BPP WO NON STRESS; Future - US MFM FETAL BPP WO NON STRESS; Future  2. Chronic hypertension during pregnancy, antepartum No meds Precautions reviewed - US MFM FETAL BPP WO NON STRESS; Future - US MFM FETAL BPP WO NON STRESS; Future  3. Chlamydia infection affecting pregnancy in third trimester, antepartum Patient made aware of results and need to inform partner for treatment as well TOC in 3 weeks if undelivered - azithromycin (ZITHROMAX) 500 MG tablet; Take 2 tablets (1,000 mg total) by mouth once.  Dispense: 2 tablet; Refill: 1  Term labor symptoms and general obstetric precautions including but not limited to vaginal bleeding, contractions, leaking of fluid and fetal movement were reviewed in detail with the patient. Please refer to After Visit Summary for other counseling recommendations.  Return in about 1 week (around 04/22/2017) for ROB.   Catalina AntiguaPeggy Jimie Kuwahara, MD

## 2017-04-17 ENCOUNTER — Emergency Department (HOSPITAL_COMMUNITY)
Admission: EM | Admit: 2017-04-17 | Discharge: 2017-04-17 | Disposition: A | Discharge status: Home / Self Care | Payer: Medicaid Other | Attending: Emergency Medicine | Admitting: Emergency Medicine

## 2017-04-17 ENCOUNTER — Encounter (HOSPITAL_COMMUNITY): Payer: Self-pay

## 2017-04-17 DIAGNOSIS — H1089 Other conjunctivitis: Secondary | ICD-10-CM | POA: Diagnosis not present

## 2017-04-17 DIAGNOSIS — J45909 Unspecified asthma, uncomplicated: Secondary | ICD-10-CM | POA: Diagnosis not present

## 2017-04-17 DIAGNOSIS — F909 Attention-deficit hyperactivity disorder, unspecified type: Secondary | ICD-10-CM | POA: Diagnosis not present

## 2017-04-17 DIAGNOSIS — Z79899 Other long term (current) drug therapy: Secondary | ICD-10-CM | POA: Insufficient documentation

## 2017-04-17 DIAGNOSIS — O9989 Other specified diseases and conditions complicating pregnancy, childbirth and the puerperium: Secondary | ICD-10-CM | POA: Insufficient documentation

## 2017-04-17 DIAGNOSIS — I10 Essential (primary) hypertension: Secondary | ICD-10-CM | POA: Diagnosis not present

## 2017-04-17 DIAGNOSIS — Z87891 Personal history of nicotine dependence: Secondary | ICD-10-CM | POA: Insufficient documentation

## 2017-04-17 DIAGNOSIS — Z3A36 36 weeks gestation of pregnancy: Secondary | ICD-10-CM | POA: Insufficient documentation

## 2017-04-17 DIAGNOSIS — H1032 Unspecified acute conjunctivitis, left eye: Secondary | ICD-10-CM

## 2017-04-17 MED ORDER — ERYTHROMYCIN 5 MG/GM OP OINT: TOPICAL_OINTMENT | OPHTHALMIC | 0 refills | Status: DC

## 2017-04-17 NOTE — Discharge Instructions (Signed)
Please review attached information.  Please call your OB/GYN and inform them of your visit today including new prescriptions.  Please verify that this is okay for you to take at this time.  Please continue using antihistamines as needed for itching.

## 2017-04-17 NOTE — ED Notes (Addendum)
Pt [redacted] weeks pregnant.  Contacting [facility] to [facility] or just check for fetal heart tones.

## 2017-04-17 NOTE — ED Notes (Signed)
Fetal heart tones noted at 159 bpm.

## 2017-04-17 NOTE — ED Notes (Signed)
Per EDP, no fetal monitoring necessary.

## 2017-04-17 NOTE — ED Provider Notes (Signed)
[facility]-EMERGENCY DEPT Provider Note   CSN: [id number] Arrival date & time: 04/17/17  1038  By signing my name below, I, Kristin Bentley, attest that this documentation has been prepared under the direction and in the presence of non-physician practitioner, Eyvonne Mechanic, PA-C. Electronically Signed: Modena Bentley, Scribe. 04/17/2017. 12:28 PM.  History   Chief Complaint Chief Complaint  Patient presents with  . Facial Swelling   The history is provided by the patient. No language interpreter was used.   HPI Comments: Kristin Bentley is a 26 y.o. female who presents to the Emergency Department complaining of constant left eye itching discharge and eye lid swelling that started yesterday. She is currently [redacted] weeks pregnant and she has recently felt fetal movement. She has a prior hx of similar complaint from an allergic reaction. Her eye was matted shut this morning so she came to the ED. No recent allergen exposure. She reports associated left eye itching/discharge with eyelid pain. She takes Zantac but did not take it today. Denies any recent allergen exposure, tongue/oral swelling, eye pain, visual disturbance, chest tightness, SOB, difficulty swallowing, abdominal pain, vaginal bleeding, or other complaints at this time.  Past Medical History:  Diagnosis Date  . ADHD (attention deficit hyperactivity disorder)   . Anemia   . Anxiety   . Anxiety with depression   . Asthma   . Chlamydia   . Gonorrhea   . Hypertension     Patient Active Problem List   Diagnosis Date Noted  . Chlamydia infection affecting pregnancy in third trimester, antepartum 04/15/2017  . Chronic hypertension during pregnancy, antepartum 02/05/2017  . Low vitamin D level 01/02/2017  . Anemia affecting pregnancy 01/02/2017  . Supervision of high risk pregnancy, antepartum 12/26/2016  . Short interval between pregnancies affecting pregnancy, antepartum 12/26/2016  . Late prenatal care affecting pregnancy in  second trimester 12/26/2016  . HTN (hypertension) 04/16/2016  . Essential hypertension 03/31/2015  . Obesity 12/14/2014  . Asthma, chronic 12/14/2014  . Metabolic syndrome 12/14/2014  . History of attention deficit hyperactivity disorder (ADHD) 12/14/2014    Past Surgical History:  Procedure Laterality Date  . WISDOM TOOTH EXTRACTION      OB History    Gravida Para Term Preterm AB Living   2 1 1     1    SAB TAB Ectopic Multiple Live Births         0 1       Home Medications    Prior to Admission medications   Medication Sig Start Date End Date Taking? Authorizing Provider  EPINEPHrine (EPIPEN 2-PAK) 0.3 mg/0.3 mL IJ SOAJ injection Inject 0.3 mg into the muscle once as needed (for severe allergic reaction).    [provider]  erythromycin ophthalmic ointment Place a 1/2 inch ribbon of ointment into the lower eyelid 4 times per day for 5 days 04/17/17   Mir Fullilove, Tinnie Gens, PA-C  ferrous sulfate 325 (65 FE) MG tablet Take 325 mg by mouth 2 (two) times daily with a meal.    [provider]  fluticasone (FLOVENT HFA) 110 MCG/ACT inhaler Inhale 2 puffs into the lungs daily. 01/07/17   Orvilla Cornwall A, CNM  ondansetron (ZOFRAN) 8 MG tablet TAKE ONE TABLET BY MOUTH EVERY 8 HOURS AS NEEDED FOR NAUSEA 04/05/17   Brock Bad, MD  Prenat-FeAsp-Meth-FA-DHA w/o A (PRENATE PIXIE) 10-0.6-0.4-200 MG CAPS Take 1 tablet by mouth daily. 12/26/16   Orvilla Cornwall A, CNM  promethazine (PHENERGAN) 25 MG tablet TAKE ONE TABLET BY MOUTH  EVERY 6 HOURS AS NEEDED FOR NAUSEA OR VOMITING 01/22/17   [provider]  ranitidine (ZANTAC) 150 MG tablet Take 1 tablet (150 mg total) by mouth at bedtime. 11/27/16 11/27/17  Donette Larry, CNM  Vitamin D, Ergocalciferol, (DRISDOL) 50000 units CAPS capsule Take 1 capsule (50,000 Units total) by mouth every 7 (seven) days. 01/02/17   Roe Coombs, CNM    Family History Family History  Problem Relation Age of Onset  . Heart disease  Mother   . Diabetes Mother   . Hypertension Mother   . Asthma Sister   . Diabetes Maternal Grandmother     Social History Social History  Substance Use Topics  . Smoking status: Former Games developer  . Smokeless tobacco: Former Neurosurgeon  . Alcohol use No     Allergies   Bee venom and Citrus   Review of Systems Review of Systems  HENT: Positive for facial swelling (left periorbital). Negative for trouble swallowing.   Eyes: Positive for discharge and itching. Negative for pain and visual disturbance.  Respiratory: Negative for chest tightness and shortness of breath.   Gastrointestinal: Negative for abdominal pain.  Genitourinary: Negative for vaginal bleeding.  All other systems reviewed and are negative.    Physical Exam Updated Vital Signs BP 130/87 (BP Location: Left Arm)   Pulse (!) 101   Temp 97.7 F (36.5 C) (Oral)   Resp 16   Ht 5\' 6"  (1.676 m)   Wt 270 lb (122.5 kg)   SpO2 100%   BMI 43.58 kg/m   Physical Exam  Constitutional: She appears well-developed and well-nourished. No distress.  HENT:  Head: Normocephalic.  Eyes: Conjunctivae and EOM are normal. Pupils are equal, round, and reactive to light.  No active discharge noted. EOM intact. Bulbar and palpebral conjunctival injection with minor surrounding lid edema. Visual acuity intact.   Neck: Neck supple.  Cardiovascular: Normal rate and regular rhythm.   Pulmonary/Chest: Effort normal.  Abdominal: Soft.  Musculoskeletal: Normal range of motion.  Neurological: She is alert.  Skin: Skin is warm and dry.  Psychiatric: She has a normal mood and affect.  Nursing note and vitals reviewed.    ED Treatments / Results  DIAGNOSTIC STUDIES: Oxygen Saturation is 100% on RA, normal by my interpretation.    COORDINATION OF CARE: 12:32 PM- Pt advised of plan for treatment and pt agrees.  Labs (all labs ordered are listed, but only abnormal results are displayed) Labs Reviewed - No data to display  EKG  EKG  Interpretation None       Radiology [place] Mfm Fetal Bpp Wo Non Stress  Result Date: 04/16/2017 ----------------------------------------------------------------------  OBSTETRICS REPORT                      (Signed Final 04/16/2017 08:55 am) ---------------------------------------------------------------------- Patient Info  ID #:       [id number]                         D.O.B.:   January 09, 1991 (26 yrs)  Name:       [facility]            Visit Date:  04/15/2017 02:58 pm ---------------------------------------------------------------------- Performed By  Performed By:     Tommi Emery         Ref. Address:     [address]  [facility]                                                             [address]                                                             [place]                                                             [id number]  Attending:        Clarene CritchleyPaul W Whitecar        [facility]                    MD  Referred By:      Roe CoombsACHELLE A                    DENNEY CNM ---------------------------------------------------------------------- Orders   #  Description                                 Code   1  US MFM FETAL BPP WO NON STRESS              [id number]  ----------------------------------------------------------------------   #  Ordered By               Order #        Accession #    Episode #   1  PEGGY CONSTANT           [id number]      [id number]     [id number]  ---------------------------------------------------------------------- Indications   [redacted] weeks gestation of pregnancy                Z3A.37   Non-reactive NST                               O28.9  ---------------------------------------------------------------------- OB History  Blood Type:            Height:         Weight (lb):  260.2    BMI:  Gravidity:    2         Term:   1        Prem:   0        SAB:   0  TOP:          0       Ectopic:  0        Living: 1  ---------------------------------------------------------------------- Fetal Evaluation  Num Of Fetuses:     1  Fetal Heart         157  Rate(bpm):  Cardiac Activity:   Observed  Presentation:  Cephalic  Amniotic Fluid  AFI FV:      Subjectively within normal limits  AFI Sum(cm)     %Tile       Largest Pocket(cm)  14.4            54          3.83  RUQ(cm)       RLQ(cm)       LUQ(cm)        LLQ(cm)  3.57          3.65          3.35           3.83 ---------------------------------------------------------------------- Biophysical Evaluation  Amniotic F.V:   Within normal limits       F. Tone:        Observed  F. Movement:    Observed                   Score:          8/8  F. Breathing:   Observed ---------------------------------------------------------------------- Gestational Age  LMP:           37w 3d       Date:   07/27/16                 EDD:   05/03/17  Best:          37w 3d    Det. By:   LMP  (07/27/16)          EDD:   05/03/17 ---------------------------------------------------------------------- Impression  Single IUP at 37w 3d  Cephalic presentation  BPP 8/8  Normal amniotic fluid volume ---------------------------------------------------------------------- Recommendations  Follow-up ultrasounds as clinically indicated. ----------------------------------------------------------------------                Candis Shine, MD Electronically Signed Final Report   04/16/2017 08:55 am ----------------------------------------------------------------------   Procedures Procedures (including critical care time)  Medications Ordered in ED Medications - No data to display   Initial Impression / Assessment and Plan / ED Course  I have reviewed the triage vital signs and the nursing notes.  Pertinent labs & imaging results that were available during my care of the patient were reviewed by me and considered in my medical decision making (see chart for details).      Final Clinical Impressions(s) /  ED Diagnoses   Final diagnoses:  Acute bacterial conjunctivitis of left eye    Patient's presentation is most consistent with conjunctivitis of her left eye.  She has redness and very minor lid edema.  She has no other signs of swelling or severe infections.  No signs of orbital cellulitis or preseptal cellulitis.  She has no other swelling or edema that would indicate any systemic illnesses.  Patient denies any abdominal pain or pregnancy related complications.  Patient will be started on antibiotics, she is encouraged to contact her OB/GYN today and inform them of today's visit and prescription for antibiotics.  She will verify that this is okay to initiate while pregnant, she will continue using antihistamines as needed for itching.  Follow-up information given return precautions given.  She verbalized understanding and agreement to today's clinic had no further questions or concerns  New Prescriptions Discharge Medication List as of 04/17/2017  1:25 PM    START taking these medications   Details  erythromycin ophthalmic ointment Place a 1/2 inch ribbon of ointment into the lower eyelid 4 times per day for 5 days, Print  I personally performed the services described in this documentation, which was scribed in my presence. The recorded information has been reviewed and is accurate.     Eyvonne Mechanic, PA-C 04/17/17 1357    Mancel Bale, MD 04/17/17 [phone]

## 2017-04-17 NOTE — ED Triage Notes (Signed)
Pt reports she woke up this morning with swelling to left eyelid and cheek. Denies SOB, speaking clear complete sentences.

## 2017-04-18 ENCOUNTER — Inpatient Hospital Stay (HOSPITAL_COMMUNITY): Payer: Self-pay | Admitting: Anesthesiology

## 2017-04-18 ENCOUNTER — Inpatient Hospital Stay (HOSPITAL_COMMUNITY)
Admit: 2017-04-18 | Discharge: 2017-04-21 | DRG: 774 | Disposition: A | Discharge status: Home / Self Care | Payer: Medicaid Other | Source: Ambulatory Visit | Attending: Family Medicine | Admitting: Family Medicine

## 2017-04-18 ENCOUNTER — Encounter (HOSPITAL_COMMUNITY): Payer: Self-pay

## 2017-04-18 ENCOUNTER — Encounter (HOSPITAL_COMMUNITY): Payer: Self-pay | Admitting: Anesthesiology

## 2017-04-18 DIAGNOSIS — Z87891 Personal history of nicotine dependence: Secondary | ICD-10-CM

## 2017-04-18 DIAGNOSIS — O0990 Supervision of high risk pregnancy, unspecified, unspecified trimester: Secondary | ICD-10-CM

## 2017-04-18 DIAGNOSIS — E6609 Other obesity due to excess calories: Secondary | ICD-10-CM

## 2017-04-18 DIAGNOSIS — O99824 Streptococcus B carrier state complicating childbirth: Secondary | ICD-10-CM | POA: Diagnosis present

## 2017-04-18 DIAGNOSIS — O9902 Anemia complicating childbirth: Secondary | ICD-10-CM | POA: Diagnosis present

## 2017-04-18 DIAGNOSIS — J45909 Unspecified asthma, uncomplicated: Secondary | ICD-10-CM | POA: Diagnosis present

## 2017-04-18 DIAGNOSIS — O0993 Supervision of high risk pregnancy, unspecified, third trimester: Secondary | ICD-10-CM

## 2017-04-18 DIAGNOSIS — O0932 Supervision of pregnancy with insufficient antenatal care, second trimester: Secondary | ICD-10-CM

## 2017-04-18 DIAGNOSIS — J4542 Moderate persistent asthma with status asthmaticus: Secondary | ICD-10-CM

## 2017-04-18 DIAGNOSIS — O1002 Pre-existing essential hypertension complicating childbirth: Principal | ICD-10-CM | POA: Diagnosis present

## 2017-04-18 DIAGNOSIS — Z3490 Encounter for supervision of normal pregnancy, unspecified, unspecified trimester: Secondary | ICD-10-CM

## 2017-04-18 DIAGNOSIS — Z3A37 37 weeks gestation of pregnancy: Secondary | ICD-10-CM

## 2017-04-18 DIAGNOSIS — O99013 Anemia complicating pregnancy, third trimester: Secondary | ICD-10-CM

## 2017-04-18 DIAGNOSIS — D649 Anemia, unspecified: Secondary | ICD-10-CM | POA: Diagnosis present

## 2017-04-18 DIAGNOSIS — O09899 Supervision of other high risk pregnancies, unspecified trimester: Secondary | ICD-10-CM

## 2017-04-18 DIAGNOSIS — Z6836 Body mass index (BMI) 36.0-36.9, adult: Secondary | ICD-10-CM

## 2017-04-18 DIAGNOSIS — A749 Chlamydial infection, unspecified: Secondary | ICD-10-CM

## 2017-04-18 DIAGNOSIS — O9952 Diseases of the respiratory system complicating childbirth: Secondary | ICD-10-CM | POA: Diagnosis present

## 2017-04-18 DIAGNOSIS — O98813 Other maternal infectious and parasitic diseases complicating pregnancy, third trimester: Secondary | ICD-10-CM

## 2017-04-18 DIAGNOSIS — O10919 Unspecified pre-existing hypertension complicating pregnancy, unspecified trimester: Secondary | ICD-10-CM

## 2017-04-18 LAB — CBC
HCT: 33.4 % — ABNORMAL LOW (ref 36.0–46.0)
Hemoglobin: 9.8 g/dL — ABNORMAL LOW (ref 12.0–15.0)
MCH: 18.1 pg — AB (ref 26.0–34.0)
MCHC: 29.3 g/dL — AB (ref 30.0–36.0)
MCV: 61.9 fL — ABNORMAL LOW (ref 78.0–100.0)
PLATELETS: 263 10*3/uL (ref 150–400)
RBC: 5.4 MIL/uL — ABNORMAL HIGH (ref 3.87–5.11)
RDW: 18.4 % — AB (ref 11.5–15.5)
WBC: 7.9 10*3/uL (ref 4.0–10.5)

## 2017-04-18 LAB — URIC ACID: Uric Acid, Serum: 3.4 mg/dL (ref 2.3–6.6)

## 2017-04-18 LAB — COMPREHENSIVE METABOLIC PANEL
ALT: 12 U/L — AB (ref 14–54)
AST: 19 U/L (ref 15–41)
Albumin: 2.9 g/dL — ABNORMAL LOW (ref 3.5–5.0)
Alkaline Phosphatase: 107 U/L (ref 38–126)
Anion gap: 7 (ref 5–15)
BILIRUBIN TOTAL: 0.4 mg/dL (ref 0.3–1.2)
CHLORIDE: 105 mmol/L (ref 101–111)
CO2: 23 mmol/L (ref 22–32)
CREATININE: 0.46 mg/dL (ref 0.44–1.00)
Calcium: 9.6 mg/dL (ref 8.9–10.3)
GFR calc Af Amer: >60 mL/min (ref >60)
Glucose, Bld: 86 mg/dL (ref 65–99)
Potassium: 3.9 mmol/L (ref 3.5–5.1)
Sodium: 135 mmol/L (ref 135–145)
TOTAL PROTEIN: 6.9 g/dL (ref 6.5–8.1)

## 2017-04-18 LAB — TYPE AND SCREEN
ABO/RH(D): O POS
Antibody Screen: NEGATIVE

## 2017-04-18 LAB — LACTATE DEHYDROGENASE: LDH: 157 U/L (ref 98–192)

## 2017-04-18 MED ORDER — PHENYLEPHRINE 40 MCG/ML (10ML) SYRINGE FOR IV PUSH (FOR BLOOD PRESSURE SUPPORT)
80.0000 ug | PREFILLED_SYRINGE | INTRAVENOUS | Status: DC | PRN
  Filled 2017-04-18: qty 5

## 2017-04-18 MED ORDER — PHENYLEPHRINE 40 MCG/ML (10ML) SYRINGE FOR IV PUSH (FOR BLOOD PRESSURE SUPPORT)
80.0000 ug | PREFILLED_SYRINGE | INTRAVENOUS | Status: DC | PRN
  Filled 2017-04-18: qty 5
  Filled 2017-04-18: qty 10

## 2017-04-18 MED ORDER — OXYTOCIN 40 UNITS IN LACTATED RINGERS INFUSION - SIMPLE MED: 2.5000 [IU]/h | INTRAVENOUS | Status: DC

## 2017-04-18 MED ORDER — OXYCODONE-ACETAMINOPHEN 5-325 MG PO TABS: 2.0000 | ORAL_TABLET | ORAL | Status: DC | PRN

## 2017-04-18 MED ORDER — FAMOTIDINE 20 MG PO TABS: 10.0000 mg | ORAL_TABLET | Freq: Every day | ORAL | Status: DC

## 2017-04-18 MED ORDER — OXYCODONE-ACETAMINOPHEN 5-325 MG PO TABS: 1.0000 | ORAL_TABLET | ORAL | Status: DC | PRN

## 2017-04-18 MED ORDER — OXYTOCIN BOLUS FROM INFUSION
500.0000 mL | Freq: Once | INTRAVENOUS | Status: AC
  Administered 2017-04-19: 500 mL via INTRAVENOUS

## 2017-04-18 MED ORDER — OXYTOCIN 40 UNITS IN LACTATED RINGERS INFUSION - SIMPLE MED
INTRAVENOUS | Status: AC
  Filled 2017-04-18: qty 1000

## 2017-04-18 MED ORDER — SODIUM CHLORIDE 0.9 % IV SOLN
2.0000 g | Freq: Once | INTRAVENOUS | Status: AC
  Administered 2017-04-18: 2 g via INTRAVENOUS
  Filled 2017-04-18: qty 2000

## 2017-04-18 MED ORDER — FERROUS SULFATE 325 (65 FE) MG PO TABS: 325.0000 mg | ORAL_TABLET | Freq: Two times a day (BID) | ORAL | Status: DC

## 2017-04-18 MED ORDER — FLEET ENEMA 7-19 GM/118ML RE ENEM: 1.0000 | ENEMA | RECTAL | Status: DC | PRN

## 2017-04-18 MED ORDER — EPHEDRINE 5 MG/ML INJ
10.0000 mg | INTRAVENOUS | Status: DC | PRN
  Filled 2017-04-18: qty 2

## 2017-04-18 MED ORDER — ONDANSETRON HCL 4 MG/2ML IJ SOLN: 4.0000 mg | Freq: Four times a day (QID) | INTRAMUSCULAR | Status: DC | PRN

## 2017-04-18 MED ORDER — BUDESONIDE 0.5 MG/2ML IN SUSP
2.0000 mL | Freq: Every day | RESPIRATORY_TRACT | Status: DC
  Filled 2017-04-18: qty 2

## 2017-04-18 MED ORDER — PENICILLIN G POT IN DEXTROSE 60000 UNIT/ML IV SOLN: 3.0000 10*6.[IU] | INTRAVENOUS | Status: DC

## 2017-04-18 MED ORDER — LIDOCAINE HCL (PF) 1 % IJ SOLN
INTRAMUSCULAR | Status: AC
  Filled 2017-04-18: qty 30

## 2017-04-18 MED ORDER — FENTANYL 2.5 MCG/ML BUPIVACAINE 1/10 % EPIDURAL INFUSION (WH - ANES)
14.0000 mL/h | INTRAMUSCULAR | Status: DC | PRN
  Filled 2017-04-18: qty 100

## 2017-04-18 MED ORDER — DIPHENHYDRAMINE HCL 50 MG/ML IJ SOLN: 12.5000 mg | INTRAMUSCULAR | Status: DC | PRN

## 2017-04-18 MED ORDER — PENICILLIN G POTASSIUM 5000000 UNITS IJ SOLR
5.0000 10*6.[IU] | Freq: Once | INTRAMUSCULAR | Status: DC
  Filled 2017-04-18: qty 5

## 2017-04-18 MED ORDER — SOD CITRATE-CITRIC ACID 500-334 MG/5ML PO SOLN: 30.0000 mL | ORAL | Status: DC | PRN

## 2017-04-18 MED ORDER — LIDOCAINE HCL (PF) 1 % IJ SOLN
30.0000 mL | INTRAMUSCULAR | Status: DC | PRN
  Filled 2017-04-18: qty 30

## 2017-04-18 MED ORDER — LACTATED RINGERS IV SOLN
500.0000 mL | Freq: Once | INTRAVENOUS | Status: AC
  Administered 2017-04-18: 500 mL via INTRAVENOUS

## 2017-04-18 MED ORDER — ACETAMINOPHEN 325 MG PO TABS: 650.0000 mg | ORAL_TABLET | ORAL | Status: DC | PRN

## 2017-04-18 MED ORDER — LACTATED RINGERS IV SOLN: 500.0000 mL | INTRAVENOUS | Status: DC | PRN

## 2017-04-18 MED ORDER — LACTATED RINGERS IV SOLN
INTRAVENOUS | Status: DC
Start: 2017-04-18 — End: 2017-04-19
  Administered 2017-04-18: 23:00:00 via INTRAVENOUS

## 2017-04-18 NOTE — H&P (Signed)
OBSTETRIC ADMISSION HISTORY AND PHYSICAL  Kristin SharpsKhadijah Bentley is a 26 y.o. female G3P1001 with IUP at 2857w6d by LMP presenting for early term active labor. She reports +FMs, No LOF, no VB, no blurry vision, headaches or peripheral edema, and RUQ pain.  She plans on breast feeding. She request BTL for birth control (has not completed paperwork).   Dating: By LMP --->  Estimated Date of Delivery: 05/03/17  Last Sono:    @[redacted]w[redacted]d , CWD, normal anatomy, cephalic presentation, 3107g/86%   Prenatal History/Complications:  Past Medical History: Past Medical History:  Diagnosis Date  . ADHD (attention deficit hyperactivity disorder)   . Anemia   . Anxiety   . Anxiety with depression   . Asthma   . Chlamydia   . Gonorrhea   . Hypertension     Past Surgical History: Past Surgical History:  Procedure Laterality Date  . WISDOM TOOTH EXTRACTION      Obstetrical History: OB History    Gravida Para Term Preterm AB Living   3 1 1     1    SAB TAB Ectopic Multiple Live Births         0 1      Social History: Social History   Social History  . Marital status: Single    Spouse name: N/A  . Number of children: N/A  . Years of education: N/A   Social History Main Topics  . Smoking status: Former Games developermoker  . Smokeless tobacco: Former NeurosurgeonUser  . Alcohol use No  . Drug use: No  . Sexual activity: Yes    Birth control/ protection: None   Other Topics Concern  . None   Social History Narrative  . None    Family History: Family History  Problem Relation Age of Onset  . Heart disease Mother   . Diabetes Mother   . Hypertension Mother   . Asthma Sister   . Diabetes Maternal Grandmother     Allergies: Allergies  Allergen Reactions  . Bee Venom Anaphylaxis  . Citrus Anaphylaxis    Prescriptions Prior to Admission  Medication Sig Dispense Refill Last Dose  . EPINEPHrine (EPIPEN 2-PAK) 0.3 mg/0.3 mL IJ SOAJ injection Inject 0.3 mg into the muscle once as needed (for severe  allergic reaction).   Not Taking  . erythromycin ophthalmic ointment Place a 1/2 inch ribbon of ointment into the lower eyelid 4 times per day for 5 days 1 g 0   . ferrous sulfate 325 (65 FE) MG tablet Take 325 mg by mouth 2 (two) times daily with a meal.   Taking  . fluticasone (FLOVENT HFA) 110 MCG/ACT inhaler Inhale 2 puffs into the lungs daily. 1 Inhaler 12 Taking  . ondansetron (ZOFRAN) 8 MG tablet TAKE ONE TABLET BY MOUTH EVERY 8 HOURS AS NEEDED FOR NAUSEA 40 tablet 2 Taking  . Prenat-FeAsp-Meth-FA-DHA w/o A (PRENATE PIXIE) 10-0.6-0.4-200 MG CAPS Take 1 tablet by mouth daily. 30 capsule 12 Taking  . promethazine (PHENERGAN) 25 MG tablet TAKE ONE TABLET BY MOUTH EVERY 6 HOURS AS NEEDED FOR NAUSEA OR VOMITING  0 Taking  . ranitidine (ZANTAC) 150 MG tablet Take 1 tablet (150 mg total) by mouth at bedtime. 30 tablet 1 Taking  . Vitamin D, Ergocalciferol, (DRISDOL) 50000 units CAPS capsule Take 1 capsule (50,000 Units total) by mouth every 7 (seven) days. 30 capsule 2 Taking     Review of Systems   All systems reviewed and negative except as stated in HPI  Blood pressure (!) 142/98,  pulse 99, temperature 97 F (36.1 C), temperature source Axillary, resp. rate 20, last menstrual period 07/27/2016, unknown if currently breastfeeding. General appearance: alert, cooperative and no distress Lungs: clear to auscultation bilaterally Heart: regular rate and rhythm Abdomen: soft, non-tender; bowel sounds normal Pelvic: Normal external genitalia  Extremities: Homans sign is negative, no sign of DVT Presentation: cephalic Fetal monitoringBaseline: 135 bpm, Variability: Good {> 6 bpm) and Accelerations: Reactive Uterine activityFrequency: Every 2 minutes Dilation: 6 Effacement (%): 90 Station: -2 Exam by:: Doreatha Lew., rn   Prenatal labs: ABO, Rh: O/Positive/-- (02/28 1658) Antibody: Negative (02/28 1658) Rubella: 3.73 (02/28 1658) RPR: Non Reactive (05/08 1450)  HBsAg: Negative (02/28 1658)   HIV: Non Reactive (05/08 1450)  GBS: Negative (06/12 1446)  1 hr Glucola normal Genetic screening negative Anatomy US @ 22 weeks, normal   Prenatal Transfer Tool  Maternal Diabetes: No Genetic Screening: Normal Maternal Ultrasounds/Referrals: Normal Fetal Ultrasounds or other Referrals:  Other: growth  Maternal Substance Abuse:  No Significant Maternal Medications:  Meds include: Zantac Significant Maternal Lab Results: Lab values include: Group B Strep positive, Other: recent chlamydia positive (9 days ago)   No results found for this or any previous visit (from the past 24 hour(s)).  Patient Active Problem List   Diagnosis Date Noted  . Supervision of high-risk pregnancy 04/18/2017  . Chlamydia infection affecting pregnancy in third trimester, antepartum 04/15/2017  . Chronic hypertension during pregnancy, antepartum 02/05/2017  . Low vitamin D level 01/02/2017  . Anemia affecting pregnancy 01/02/2017  . Supervision of high risk pregnancy, antepartum 12/26/2016  . Short interval between pregnancies affecting pregnancy, antepartum 12/26/2016  . Late prenatal care affecting pregnancy in second trimester 12/26/2016  . HTN (hypertension) 04/16/2016  . Essential hypertension 03/31/2015  . Obesity 12/14/2014  . Asthma, chronic 12/14/2014  . Metabolic syndrome 12/14/2014  . History of attention deficit hyperactivity disorder (ADHD) 12/14/2014    Assessment/Plan:  [name] is a 26 y.o. G3P1001 at [redacted]w[redacted]d here for early term active labor   #cHTN, not on meds: mildly elevated on presentation. Will check PIH labs and UP:C. Will monitor BP and treat severe range > 160/110.  #Labor: Expectant management  #Pain: Epidural  #FWB: Cat I  #ID: GBS pos, PCN ppx  #MOF: Breast  #MOC: BTL #Circ: Desire outpatient   Al Corpus, MD  04/18/2017, 11:01 PM   \I have seen and examined this patient and agree with the management plan.

## 2017-04-18 NOTE — MAU Note (Signed)
Notified provider that patient is 6/90/-2 GBS + ctx every 3-4. Provider said to admit patient.

## 2017-04-18 NOTE — Anesthesia Preprocedure Evaluation (Signed)
Anesthesia Evaluation  Patient identified by MRN, date of birth, ID band Patient awake    Reviewed: Allergy & Precautions, Patient's Chart, lab work & pertinent test results  Airway Mallampati: III  TM Distance: >3 FB Neck ROM: Full    Dental  (+) Teeth Intact   Pulmonary asthma , former smoker,    Pulmonary exam normal breath sounds clear to auscultation       Cardiovascular hypertension, Normal cardiovascular exam Rhythm:Regular Rate:Normal     Neuro/Psych PSYCHIATRIC DISORDERS Anxiety Depression    GI/Hepatic Neg liver ROS, GERD  Medicated,  Endo/Other  Morbid obesity  Renal/GU negative Renal ROS  negative genitourinary   Musculoskeletal negative musculoskeletal ROS (+)   Abdominal (+) + obese,   Peds  Hematology  (+) anemia ,   Anesthesia Other Findings   Reproductive/Obstetrics PTL 37 weeks cHTN                             Anesthesia Physical Anesthesia Plan  ASA: III  Anesthesia Plan: Epidural   Post-op Pain Management:    Induction:   PONV Risk Score and Plan:   Airway Management Planned: Natural Airway  Additional Equipment:   Intra-op Plan:   Post-operative Plan:   Informed Consent: I have reviewed the patients History and Physical, chart, labs and discussed the procedure including the risks, benefits and alternatives for the proposed anesthesia with the patient or authorized representative who has indicated his/her understanding and acceptance.     Plan Discussed with: Anesthesiologist  Anesthesia Plan Comments:         Anesthesia Quick Evaluation

## 2017-04-19 ENCOUNTER — Encounter (HOSPITAL_COMMUNITY): Payer: Self-pay | Admitting: *Deleted

## 2017-04-19 DIAGNOSIS — O1002 Pre-existing essential hypertension complicating childbirth: Secondary | ICD-10-CM | POA: Diagnosis present

## 2017-04-19 DIAGNOSIS — J45909 Unspecified asthma, uncomplicated: Secondary | ICD-10-CM | POA: Diagnosis present

## 2017-04-19 DIAGNOSIS — Z3490 Encounter for supervision of normal pregnancy, unspecified, unspecified trimester: Secondary | ICD-10-CM

## 2017-04-19 DIAGNOSIS — Z3A37 37 weeks gestation of pregnancy: Secondary | ICD-10-CM

## 2017-04-19 DIAGNOSIS — Z87891 Personal history of nicotine dependence: Secondary | ICD-10-CM | POA: Diagnosis not present

## 2017-04-19 DIAGNOSIS — O9952 Diseases of the respiratory system complicating childbirth: Secondary | ICD-10-CM | POA: Diagnosis present

## 2017-04-19 DIAGNOSIS — O99824 Streptococcus B carrier state complicating childbirth: Secondary | ICD-10-CM | POA: Diagnosis present

## 2017-04-19 DIAGNOSIS — O9902 Anemia complicating childbirth: Secondary | ICD-10-CM | POA: Diagnosis present

## 2017-04-19 DIAGNOSIS — Z3493 Encounter for supervision of normal pregnancy, unspecified, third trimester: Secondary | ICD-10-CM | POA: Diagnosis present

## 2017-04-19 DIAGNOSIS — O1092 Unspecified pre-existing hypertension complicating childbirth: Secondary | ICD-10-CM

## 2017-04-19 DIAGNOSIS — D649 Anemia, unspecified: Secondary | ICD-10-CM | POA: Diagnosis present

## 2017-04-19 LAB — RPR: RPR: NONREACTIVE

## 2017-04-19 LAB — PROTEIN / CREATININE RATIO, URINE
CREATININE, URINE: 41 mg/dL
Protein Creatinine Ratio: 0.2 mg/mg{Cre} — ABNORMAL HIGH (ref 0.00–0.15)
Total Protein, Urine: 8 mg/dL

## 2017-04-19 MED ORDER — ZOLPIDEM TARTRATE 5 MG PO TABS: 5.0000 mg | ORAL_TABLET | Freq: Every evening | ORAL | Status: DC | PRN

## 2017-04-19 MED ORDER — ONDANSETRON HCL 4 MG/2ML IJ SOLN: 4.0000 mg | INTRAMUSCULAR | Status: DC | PRN

## 2017-04-19 MED ORDER — OXYCODONE HCL 5 MG PO TABS: 10.0000 mg | ORAL_TABLET | ORAL | Status: DC | PRN

## 2017-04-19 MED ORDER — DIBUCAINE 1 % RE OINT
1.0000 "application " | TOPICAL_OINTMENT | RECTAL | Status: DC | PRN
  Administered 2017-04-20: 1 via RECTAL
  Filled 2017-04-19: qty 28

## 2017-04-19 MED ORDER — COCONUT OIL OIL: 1.0000 "application " | TOPICAL_OIL | Status: DC | PRN

## 2017-04-19 MED ORDER — DIPHENHYDRAMINE HCL 25 MG PO CAPS: 25.0000 mg | ORAL_CAPSULE | Freq: Four times a day (QID) | ORAL | Status: DC | PRN

## 2017-04-19 MED ORDER — PRENATAL MULTIVITAMIN CH
1.0000 | ORAL_TABLET | Freq: Every day | ORAL | Status: DC
  Administered 2017-04-19 – 2017-04-21 (×3): 1 via ORAL
  Filled 2017-04-19 (×3): qty 1

## 2017-04-19 MED ORDER — METHYLERGONOVINE MALEATE 0.2 MG PO TABS: 0.2000 mg | ORAL_TABLET | ORAL | Status: DC | PRN

## 2017-04-19 MED ORDER — FERROUS SULFATE 325 (65 FE) MG PO TABS
325.0000 mg | ORAL_TABLET | Freq: Two times a day (BID) | ORAL | Status: DC
  Administered 2017-04-19 – 2017-04-20 (×4): 325 mg via ORAL
  Filled 2017-04-19 (×4): qty 1

## 2017-04-19 MED ORDER — METHYLERGONOVINE MALEATE 0.2 MG/ML IJ SOLN: 0.2000 mg | INTRAMUSCULAR | Status: DC | PRN

## 2017-04-19 MED ORDER — WITCH HAZEL-GLYCERIN EX PADS
1.0000 "application " | MEDICATED_PAD | CUTANEOUS | Status: DC | PRN
  Administered 2017-04-20: 1 via TOPICAL

## 2017-04-19 MED ORDER — BISACODYL 10 MG RE SUPP: 10.0000 mg | Freq: Every day | RECTAL | Status: DC | PRN

## 2017-04-19 MED ORDER — OXYCODONE HCL 5 MG PO TABS: 5.0000 mg | ORAL_TABLET | ORAL | Status: DC | PRN

## 2017-04-19 MED ORDER — IBUPROFEN 600 MG PO TABS
600.0000 mg | ORAL_TABLET | Freq: Four times a day (QID) | ORAL | Status: DC
  Administered 2017-04-19 – 2017-04-21 (×10): 600 mg via ORAL
  Filled 2017-04-19 (×10): qty 1

## 2017-04-19 MED ORDER — SIMETHICONE 80 MG PO CHEW: 80.0000 mg | CHEWABLE_TABLET | ORAL | Status: DC | PRN

## 2017-04-19 MED ORDER — FLEET ENEMA 7-19 GM/118ML RE ENEM: 1.0000 | ENEMA | Freq: Every day | RECTAL | Status: DC | PRN

## 2017-04-19 MED ORDER — DOCUSATE SODIUM 100 MG PO CAPS
100.0000 mg | ORAL_CAPSULE | Freq: Two times a day (BID) | ORAL | Status: DC
  Administered 2017-04-19 – 2017-04-21 (×4): 100 mg via ORAL
  Filled 2017-04-19 (×4): qty 1

## 2017-04-19 MED ORDER — PNEUMOCOCCAL VAC POLYVALENT 25 MCG/0.5ML IJ INJ
0.5000 mL | INJECTION | INTRAMUSCULAR | Status: AC
  Administered 2017-04-20: 0.5 mL via INTRAMUSCULAR
  Filled 2017-04-19 (×2): qty 0.5

## 2017-04-19 MED ORDER — TETANUS-DIPHTH-ACELL PERTUSSIS 5-2.5-18.5 LF-MCG/0.5 IM SUSP: 0.5000 mL | Freq: Once | INTRAMUSCULAR | Status: DC

## 2017-04-19 MED ORDER — MEASLES, MUMPS & RUBELLA VAC ~~LOC~~ INJ
0.5000 mL | INJECTION | Freq: Once | SUBCUTANEOUS | Status: DC
  Filled 2017-04-19: qty 0.5

## 2017-04-19 MED ORDER — ONDANSETRON HCL 4 MG PO TABS: 4.0000 mg | ORAL_TABLET | ORAL | Status: DC | PRN

## 2017-04-19 MED ORDER — BENZOCAINE-MENTHOL 20-0.5 % EX AERO
1.0000 "application " | INHALATION_SPRAY | CUTANEOUS | Status: DC | PRN
  Administered 2017-04-19: 1 via TOPICAL
  Filled 2017-04-19: qty 56

## 2017-04-19 MED ORDER — ACETAMINOPHEN 325 MG PO TABS
650.0000 mg | ORAL_TABLET | ORAL | Status: DC | PRN
  Administered 2017-04-20: 650 mg via ORAL

## 2017-04-19 NOTE — Anesthesia Postprocedure Evaluation (Signed)
Anesthesia Post Note  Patient: Phoebe SharpsKhadijah Wentzell  Procedure(s) Performed: * No procedures listed *     Patient location during evaluation: Mother Baby Anesthesia Type: Epidural Level of consciousness: awake Pain management: pain level controlled Vital Signs Assessment: post-procedure vital signs reviewed and stable Respiratory status: spontaneous breathing Cardiovascular status: stable Postop Assessment: no headache, no backache, epidural receding and patient able to bend at knees Anesthetic complications: no    Last Vitals:  Vitals:   04/19/17 0303 04/19/17 0400  BP: 123/61 (!) 136/59  Pulse: 92 83  Resp: 14 16  Temp: 37.2 C 37.2 C    Last Pain:  Vitals:   04/19/17 0400  TempSrc: Oral  PainSc: 0-No pain   Pain Goal:                 Edison PaceWILKERSON,Izic Stfort

## 2017-04-19 NOTE — Lactation Note (Signed)
This note was copied from a baby's chart. Lactation Consultation Note  Patient Name: Kristin Bentley BJYNW'GToday's Date: 04/19/2017 Reason for consult: Initial assessment Breastfeeding consultation services and support information given to patient.  She states she pumped and bottle fed her last baby.  Baby is 6813 hours old and mom states newborn is latching easily.  Instructed to feed with any feeding cue and call for assist prn.  Maternal Data    Feeding Feeding Type: Breast Fed Length of feed: 11 min  LATCH Score/Interventions                      Lactation Tools Discussed/Used     Consult Status Consult Status: Follow-up Date: 04/20/17 Follow-up type: In-patient    Huston FoleyMOULDEN, Chivonne Rascon S 04/19/2017, 1:29 PM

## 2017-04-20 NOTE — Progress Notes (Signed)
CSW received consult due to score greater than 9, or positive for SI on [place] Depression Screen.   CSW provided education regarding Baby Blues vs PMADs and provided MOB with information about [facility] groups [facility]. CSW encouraged MOB to evaluate her mental health throughout the postpartum period with the use of the New Mom Checklist developed by Postpartum Progress and notify a medical professional if symptoms arise.  CSW identifies no further need for intervention at this time or barriers to discharge.  Karime Scheuermann, MSW, LCSW-A Clinical Social Worker  [facility]  Office: [phone]   

## 2017-04-20 NOTE — Lactation Note (Signed)
This note was copied from a baby's chart. Lactation Consultation Note  Patient Name: Kristin Bentley ZOXWR'UToday's Date: 04/20/2017 Reason for consult: Follow-up assessment   Follow up with mom of 43 hour old infant at RN request. Mom reports she has been experiencing some nipple pain with latch. She reports infant clamps down with initial latch. The RN's assisted her with latching infant and she reports no pain with latch at this time. Mom reports she feels she will need further assistance with latch. Enc her to call out for feeding assistance as needed.   Infant with 7 BF for 10-20 minutes, 1 attempt, 4 voids and 2 stools in last 24 hours. LATCH Scores 8-9. Mom is leaking out of there breast with feeding. Mom was using awakening techniques as needed to keep infant awake at breast. Enc her to apply EBM to nipples post BF. Mom without further questions/concerns at this time. She is to call out if she would like further assistance tonight.    Maternal Data Formula Feeding for Exclusion: No Has patient been taught Hand Expression?: Yes Does the patient have breastfeeding experience prior to this delivery?: Yes  Feeding Feeding Type: Breast Fed Length of feed: 10 min (still feeding when LC left room)  LATCH Score/Interventions Latch: Grasps breast easily, tongue down, lips flanged, rhythmical sucking. Intervention(s): Adjust position;Assist with latch;Breast massage;Breast compression  Audible Swallowing: A few with stimulation Intervention(s): Skin to skin;Hand expression;Alternate breast massage  Type of Nipple: Everted at rest and after stimulation  Comfort (Breast/Nipple): Filling, red/small blisters or bruises, mild/mod discomfort  Problem noted: Mild/Moderate discomfort Interventions  (Cracked/bleeding/bruising/blister): Expressed breast milk to nipple  Hold (Positioning): Assistance needed to correctly position infant at breast and maintain latch. Intervention(s):  Breastfeeding basics reviewed;Support Pillows;Position options;Skin to skin  LATCH Score: 7  Lactation Tools Discussed/Used     Consult Status Consult Status: Follow-up Date: 04/21/17 Follow-up type: In-patient    Silas FloodSharon S Hice 04/20/2017, 7:36 PM

## 2017-04-20 NOTE — Progress Notes (Signed)
Post Partum Day #1 Subjective: no complaints, up ad lib and tolerating PO; breastfeeding going well; desires  Nexplanon for contraception  Objective: Blood pressure 126/73, pulse 68, temperature 98.1 F (36.7 C), temperature source Oral, resp. rate 18, height 5\' 6"  (1.676 m), weight 122.5 kg (270 lb), last menstrual period 07/27/2016, SpO2 99 %, unknown if currently breastfeeding.  Physical Exam:  General: alert, cooperative and no distress Lochia: appropriate Uterine Fundus: firm DVT Evaluation: No evidence of DVT seen on physical exam.   Recent Labs  04/18/17 2250  HGB 9.8*  HCT 33.4*    Assessment/Plan: Plan for discharge tomorrow   LOS: 2 days   Cam HaiSHAW, Arren Laminack CNM 04/20/2017, 9:07 AM

## 2017-04-21 MED ORDER — ACETAMINOPHEN 325 MG PO TABS: 650.0000 mg | ORAL_TABLET | ORAL | 0 refills | Status: DC | PRN

## 2017-04-21 MED ORDER — IBUPROFEN 600 MG PO TABS: 600.0000 mg | ORAL_TABLET | Freq: Four times a day (QID) | ORAL | 0 refills | Status: DC

## 2017-04-21 MED ORDER — OXYCODONE HCL 5 MG PO TABS: 5.0000 mg | ORAL_TABLET | ORAL | 0 refills | Status: DC | PRN

## 2017-04-21 NOTE — Lactation Note (Signed)
This note was copied from a baby's chart. Lactation Consultation Note  Patient Name: Kristin Phoebe SharpsKhadijah Chancy [id number]'XToday's Date: 04/21/2017 Reason for consult: Follow-up assessment;Infant weight loss;Breast/nipple pain (8% weight loss , Sore nipples ) Baby is 5059 hours old ,  LC reviewed doc flow sheets and updated.  Baby has been exclusively breast fed except a supplement of formula during the night./ [facility]. Per mom having some nipple soreness, LC assessed and noted some cracking at the base of the nipples.  LC recommended using EBM liberally on nipples and the crack at the base until heals, also comfort gels.  LC instructed mom the use of shells , and advised to hold off on using them until after the the cracking is resolved.  Per mom the RN had given her a hand pump, LC provided a #27 flange for when the milk comes in.  Sore nipple and engorgement prevention and tx reviewed.  Per mom was active with WIC with her 1st baby but not presently. LC enc mom to call for appt. / had the number. Mother informed of post-discharge support and given phone number to the lactation department, including services for phone call assistance; out-patient appointments; and breastfeeding support group. List of other breastfeeding resources in the community given in the handout. Encouraged mother to call for problems or concerns related to breastfeeding.  See below for Latch score and latch , baby fed and was nutritive for 20 mins , multiple swallows noted and per mom  More comfortable.   Maternal Data Has patient been taught Hand Expression?: Yes  Feeding Feeding Type: Breast Fed Length of feed:  (multiple swallows noted , increased breast compressions )  LATCH Score/Interventions Latch: Grasps breast easily, tongue down, lips flanged, rhythmical sucking. Intervention(s): Adjust position;Assist with latch;Breast massage;Breast compression  Audible Swallowing: Spontaneous and  intermittent Intervention(s): Skin to skin;Hand expression  Type of Nipple: Everted at rest and after stimulation  Comfort (Breast/Nipple): Filling, red/small blisters or bruises, mild/mod discomfort  Problem noted: Mild/Moderate discomfort;Cracked, bleeding, blisters, bruises Interventions  (Cracked/bleeding/bruising/blister): Expressed breast milk to nipple Interventions (Mild/moderate discomfort): Hand massage;Hand expression  Hold (Positioning): Assistance needed to correctly position infant at breast and maintain latch. Intervention(s): Breastfeeding basics reviewed;Support Pillows;Position options;Skin to skin  LATCH Score: 8  Lactation Tools Discussed/Used Tools: Pump;Flanges Flange Size: 27 Breast pump type: Manual WIC Program: No Pump Review: Setup, frequency, and cleaning;Milk Storage Initiated by:: MAI  Date initiated:: 04/21/17   Consult Status Consult Status: Complete Date: 04/21/17    Matilde SprangMargaret Ann Aden Sek 04/21/2017, 12:02 PM

## 2017-04-21 NOTE — Discharge Summary (Signed)
OB Discharge Summary     Patient Name: Kristin Bentley DOB: [birth year] MRN: [id number]  Date of admission: 04/18/2017 Delivering MD: Mauri Reading R   Date of discharge: 04/21/2017  Admitting diagnosis: 37 WKS, LABOR CHECK Intrauterine pregnancy: [redacted]w[redacted]d     Secondary diagnosis:  Principal Problem:   Normal labor Active Problems:   Supervision of high-risk pregnancy   Pregnancy  Additional problems: none     Discharge diagnosis: Term Pregnancy Delivered                                                                                                Post partum procedures:none  Augmentation: none  Complications: None  Hospital course:  Onset of Labor With Vaginal Delivery     26 y.o. yo [id number] at [redacted]w[redacted]d was admitted in Latent Labor on 04/18/2017. Patient had an uncomplicated labor course as follows:  Membrane Rupture Time/Date: 12:15 AM ,04/19/2017   Intrapartum Procedures: Episiotomy: None [1]                                         Lacerations:  None [1]  Patient had a delivery of a Viable infant. 04/19/2017  Information for the patient's newborn:  Kristin, Bentley [[id number]]       Pateint had an uncomplicated postpartum course.  She is ambulating, tolerating a regular diet, passing flatus, and urinating well. Patient is discharged home in stable condition on 04/21/17.   Physical exam  Vitals:   04/19/17 1840 04/20/17 0529 04/20/17 1731 04/21/17 0614  BP: 140/77 126/73 128/64 139/65  Pulse: 87 68 84 82  Resp: 20 18 20 18   Temp: 98.6 F (37 C) 98.1 F (36.7 C) 98.2 F (36.8 C) 98 F (36.7 C)  TempSrc: Oral Oral Oral Oral  SpO2:   99%   Weight:      Height:       General: alert, cooperative and no distress Lochia: appropriate Uterine Fundus: firm Incision: N/A DVT Evaluation: No evidence of DVT seen on physical exam. Labs: Lab Results  Component Value Date   WBC 7.9 04/18/2017   HGB 9.8 (L) 04/18/2017   HCT 33.4 (L) 04/18/2017   MCV 61.9  (L) 04/18/2017   PLT 263 04/18/2017   CMP Latest Ref Rng & Units 04/18/2017  Glucose 65 - 99 mg/dL 86  BUN 6 - 20 mg/dL <1(H)  Creatinine 0.86 - 1.00 mg/dL 5.78  Sodium 469 - 629 mmol/L 135  Potassium 3.5 - 5.1 mmol/L 3.9  Chloride 101 - 111 mmol/L 105  CO2 22 - 32 mmol/L 23  Calcium 8.9 - 10.3 mg/dL 9.6  Total Protein 6.5 - 8.1 g/dL 6.9  Total Bilirubin 0.3 - 1.2 mg/dL 0.4  Alkaline Phos 38 - 126 U/L 107  AST 15 - 41 U/L 19  ALT 14 - 54 U/L 12(L)    Discharge instruction: per After Visit Summary and "Baby and Me Booklet".  After visit meds:  Allergies as of 04/21/2017  Reactions   Bee Venom Anaphylaxis   Citrus Anaphylaxis      Medication List    STOP taking these medications   EPIPEN 2-PAK 0.3 mg/0.3 mL Soaj injection Generic drug:  EPINEPHrine   erythromycin ophthalmic ointment   ondansetron 8 MG tablet Commonly known as:  ZOFRAN   promethazine 25 MG tablet Commonly known as:  PHENERGAN   ranitidine 150 MG tablet Commonly known as:  ZANTAC   Vitamin D (Ergocalciferol) 50000 units Caps capsule Commonly known as:  DRISDOL     TAKE these medications   acetaminophen 325 MG tablet Commonly known as:  TYLENOL Take 2 tablets (650 mg total) by mouth every 4 (four) hours as needed (for pain scale < 4).   ferrous sulfate 325 (65 FE) MG tablet Take 325 mg by mouth 2 (two) times daily with a meal.   fluticasone 110 MCG/ACT inhaler Commonly known as:  FLOVENT HFA Inhale 2 puffs into the lungs daily.   ibuprofen 600 MG tablet Commonly known as:  ADVIL,MOTRIN Take 1 tablet (600 mg total) by mouth every 6 (six) hours.   oxyCODONE 5 MG immediate release tablet Commonly known as:  Oxy IR/ROXICODONE Take 1 tablet (5 mg total) by mouth every 4 (four) hours as needed (pain scale 4-7).   PRENATE PIXIE 10-0.6-0.4-200 MG Caps Take 1 tablet by mouth daily.       Diet: routine diet  Activity: Advance as tolerated. Pelvic rest for 6 weeks.   Outpatient follow  up:6 weeks Follow up Appt:No future appointments. Follow up Visit:No Follow-up on file.  Postpartum contraception: Nexplanon  Newborn Data: Live born female  Birth Weight: 8 lb 5.5 oz (3785 g) APGAR: 8, 9  Baby Feeding: Breast Disposition:home with mother   04/21/2017 Kristin PennaNicholas Yasmyn Bellisario, MD

## 2017-04-22 ENCOUNTER — Encounter: Payer: Medicaid Other | Admitting: Obstetrics and Gynecology

## 2017-04-24 MED ORDER — LIDOCAINE HCL (PF) 1 % IJ SOLN
INTRAMUSCULAR | Status: DC | PRN
  Administered 2017-04-18 (×2): 4 mL via EPIDURAL

## 2017-04-24 MED ORDER — FENTANYL 2.5 MCG/ML BUPIVACAINE 1/10 % EPIDURAL INFUSION (WH - ANES)
INTRAMUSCULAR | Status: DC | PRN
  Administered 2017-04-18: 14 mL/h via EPIDURAL

## 2017-04-24 NOTE — Anesthesia Procedure Notes (Signed)
Epidural Patient location during procedure: OB Start time: 04/18/2017 11:35 PM  Preanesthetic Checklist Completed: patient identified, site marked, surgical consent, pre-op evaluation, timeout performed, IV checked, risks and benefits discussed and monitors and equipment checked  Epidural Patient position: sitting Prep: site prepped and draped and DuraPrep Patient monitoring: continuous pulse ox and blood pressure Approach: midline Location: L3-L4 Injection technique: LOR air  Needle:  Needle type: Tuohy  Needle gauge: 17 G Needle length: 9 cm and 9 Needle insertion depth: 8 cm Catheter type: closed end flexible Catheter size: 19 Gauge Catheter at skin depth: 13 cm Test dose: negative and Other  Assessment Events: blood not aspirated, injection not painful, no injection resistance, negative IV test and no paresthesia  Additional Notes Patient identified. Risks and benefits discussed including failed block, incomplete  Pain control, post dural puncture headache, nerve damage, paralysis, blood pressure Changes, nausea, vomiting, reactions to medications-both toxic and allergic and post Partum back pain. All questions were answered. Patient expressed understanding and wished to proceed. Sterile technique was used throughout procedure. Epidural site was Dressed with sterile barrier dressing. No paresthesias, signs of intravascular injection Or signs of intrathecal spread were encountered.  Patient was more comfortable after the epidural was dosed. Please see RN's note for documentation of vital signs and FHR which are stable.

## 2017-04-29 ENCOUNTER — Encounter: Payer: Medicaid Other | Admitting: Obstetrics and Gynecology

## 2017-05-20 ENCOUNTER — Encounter: Payer: Self-pay | Admitting: *Deleted

## 2017-05-20 ENCOUNTER — Ambulatory Visit (INDEPENDENT_AMBULATORY_CARE_PROVIDER_SITE_OTHER): Payer: Medicaid Other | Admitting: Obstetrics and Gynecology

## 2017-05-20 VITALS — BP 130/82 | HR 87 | Ht 66.0 in | Wt 260.3 lb

## 2017-05-20 DIAGNOSIS — O99345 Other mental disorders complicating the puerperium: Principal | ICD-10-CM

## 2017-05-20 DIAGNOSIS — F53 Postpartum depression: Secondary | ICD-10-CM

## 2017-05-20 MED ORDER — SERTRALINE HCL 50 MG PO TABS: 50.0000 mg | ORAL_TABLET | Freq: Every day | ORAL | 3 refills | Status: DC

## 2017-05-20 NOTE — Progress Notes (Signed)
Post Partum Exam  Kristin Bentley is a 26 y.o. [id number] female who presents for a postpartum visit. She is 4 weeks postpartum following a spontaneous vaginal delivery. I have fully reviewed the prenatal and intrapartum course. The delivery was at 38 gestational weeks.  Anesthesia: epidural. Postpartum course has been uncomplicated. Baby's course has been uncomplicated. Baby is feeding by both breast and bottle - Similac Soy. Bleeding no bleeding. Bowel function is constipation. Bladder function is normal. Patient is not sexually active. Contraception method is abstinence. Postpartum depression screening:positive. Patient reports feeling overwhelmed at times. She also reports the recent death of her step father. Her mother who was in town helping her, left to care for her fiancee affairs. She denies any suicidal and homicidal ideations     Review of Systems Pertinent items are noted in HPI.    Objective:  unknown if currently breastfeeding.  General:  alert, cooperative and no distress   Breasts:  inspection negative, no nipple discharge or bleeding, no masses or nodularity palpable  Lungs: clear to auscultation bilaterally  Heart:  regular rate and rhythm  Abdomen: soft, non-tender; bowel sounds normal; no masses,  no organomegaly   Vulva:  normal  Vagina: normal vagina, no discharge, exudate, lesion, or erythema  Cervix:  multiparous appearance  Corpus: normal size, contour, position, consistency, mobility, non-tender  Adnexa:  no mass, fullness, tenderness  Rectal Exam: Not performed.        Assessment:    Normal postpartum exam. Pap smear not done at today's visit (normal 11/2016).   Plan:   1. Contraception: Nexplanon  Patient given informed consent, signed copy in the chart, time out was performed. Pregnancy test was negative. Appropriate time out taken.  Patient's left arm was prepped and draped in the usual sterile fashion.. The ruler used to measure and mark insertion area.   Patient was prepped with alcohol swab and then injected with 2 cc of 1% lidocaine with epinephrine.  Patient was prepped with betadine, Nexplanon removed form packaging.  Device confirmed in needle, then inserted full length of needle and withdrawn per handbook instructions.  Patient insertion site covered with a band-aid and pressure dressing.   Minimal blood loss.  Patient tolerated the procedure well.   2. Patient is medically cleared to resume all activities of daily living 3. Patient with postpartum depression. She agrees to Rx zoloft and refferal to behavioral health 4. Follow up in: as needed.

## 2017-05-28 ENCOUNTER — Institutional Professional Consult (permissible substitution): Payer: Medicaid Other

## 2017-05-28 NOTE — BH Specialist Note (Deleted)
Integrated Behavioral Health Initial Visit  MRN: [id number] Name: Kristin Bentley   Session Start time: *** Session End time: *** Total time: {IBH Total Time:21014050}  Type of Service: Integrated Behavioral Health- Individual/Family Interpretor:No. Interpretor Name and Language: n/a   Warm Hand Off Completed.       SUBJECTIVE: Kristin Bentley is a 26 y.o. female accompanied by {Persons; PED relatives w/patient:19415}. Patient was referred by Dr Jolayne Pantheronstant for postpartum depression. Patient reports the following symptoms/concerns: *** Duration of problem: ***; Severity of problem: {Mild/Moderate/Severe:20260}  OBJECTIVE: Mood: {BHH MOOD:22306} and Affect: {BHH AFFECT:22307} Risk of harm to self or others: {CHL AMB BH Suicide Current Mental Status:21022748}   LIFE CONTEXT: Family and Social: *** School/Work: *** Self-Care: *** Life Changes: Recent childbirth, ***  GOALS ADDRESSED: Patient will reduce symptoms of: {IBH Symptoms:21014056} and increase knowledge and/or ability of: {IBH Patient Tools:21014057} and also: {IBH Goals:21014053}   INTERVENTIONS: {IBH Interventions:21014054}  Standardized Assessments completed: {IBH Screening Tools:21014051}  ASSESSMENT: Patient currently experiencing ***. Patient may benefit from psychoeducation and brief therapeutic interventions regarding coping with symptoms of ***.  PLAN: 1. Follow up with behavioral health clinician on : *** 2. Behavioral recommendations:  -*** -*** -*** 3. Referral(s): {IBH Referrals:21014055} 4. "From scale of 1-10, how likely are you to follow plan?": ***  Lashawndra Lampkins C Azahel Belcastro, LCSWA

## 2017-09-30 ENCOUNTER — Encounter: Payer: Self-pay | Admitting: Licensed Clinical Social Worker

## 2017-10-26 ENCOUNTER — Other Ambulatory Visit: Payer: Self-pay | Admitting: Obstetrics

## 2017-11-18 ENCOUNTER — Emergency Department (HOSPITAL_COMMUNITY)
Admission: EM | Admit: 2017-11-18 | Discharge: 2017-11-19 | Disposition: A | Discharge status: Home / Self Care | Payer: Medicaid Other | Attending: Emergency Medicine | Admitting: Emergency Medicine

## 2017-11-18 ENCOUNTER — Encounter (HOSPITAL_COMMUNITY): Payer: Self-pay | Admitting: Emergency Medicine

## 2017-11-18 ENCOUNTER — Emergency Department (HOSPITAL_COMMUNITY): Payer: Medicaid Other

## 2017-11-18 DIAGNOSIS — Z87891 Personal history of nicotine dependence: Secondary | ICD-10-CM | POA: Insufficient documentation

## 2017-11-18 DIAGNOSIS — Z79899 Other long term (current) drug therapy: Secondary | ICD-10-CM | POA: Diagnosis not present

## 2017-11-18 DIAGNOSIS — J45901 Unspecified asthma with (acute) exacerbation: Secondary | ICD-10-CM | POA: Diagnosis not present

## 2017-11-18 DIAGNOSIS — F419 Anxiety disorder, unspecified: Secondary | ICD-10-CM | POA: Insufficient documentation

## 2017-11-18 DIAGNOSIS — I10 Essential (primary) hypertension: Secondary | ICD-10-CM | POA: Diagnosis not present

## 2017-11-18 DIAGNOSIS — F909 Attention-deficit hyperactivity disorder, unspecified type: Secondary | ICD-10-CM | POA: Insufficient documentation

## 2017-11-18 DIAGNOSIS — J4520 Mild intermittent asthma, uncomplicated: Secondary | ICD-10-CM

## 2017-11-18 DIAGNOSIS — R0602 Shortness of breath: Secondary | ICD-10-CM | POA: Diagnosis present

## 2017-11-18 LAB — I-STAT TROPONIN, ED: TROPONIN I, POC: 0 ng/mL (ref 0.00–0.08)

## 2017-11-18 LAB — CBC
HEMATOCRIT: 34.6 % — AB (ref 36.0–46.0)
Hemoglobin: 10.1 g/dL — ABNORMAL LOW (ref 12.0–15.0)
MCH: 18.1 pg — ABNORMAL LOW (ref 26.0–34.0)
MCHC: 29.2 g/dL — ABNORMAL LOW (ref 30.0–36.0)
MCV: 61.9 fL — ABNORMAL LOW (ref 78.0–100.0)
PLATELETS: 448 10*3/uL — AB (ref 150–400)
RBC: 5.59 MIL/uL — ABNORMAL HIGH (ref 3.87–5.11)
RDW: 17 % — AB (ref 11.5–15.5)
WBC: 6.9 10*3/uL (ref 4.0–10.5)

## 2017-11-18 LAB — BASIC METABOLIC PANEL
Anion gap: 12 (ref 5–15)
BUN: 7 mg/dL (ref 6–20)
CO2: 22 mmol/L (ref 22–32)
Calcium: 9.1 mg/dL (ref 8.9–10.3)
Chloride: 102 mmol/L (ref 101–111)
Creatinine, Ser: 0.79 mg/dL (ref 0.44–1.00)
GFR calc Af Amer: >60 mL/min (ref >60)
Glucose, Bld: 91 mg/dL (ref 65–99)
POTASSIUM: 3.2 mmol/L — AB (ref 3.5–5.1)
Sodium: 136 mmol/L (ref 135–145)

## 2017-11-18 LAB — POC URINE PREG, ED: PREG TEST UR: NEGATIVE

## 2017-11-18 MED ORDER — ALBUTEROL SULFATE (2.5 MG/3ML) 0.083% IN NEBU
5.0000 mg | INHALATION_SOLUTION | Freq: Once | RESPIRATORY_TRACT | Status: AC
Start: 2017-11-19 — End: 2017-11-19
  Administered 2017-11-19: 5 mg via RESPIRATORY_TRACT
  Filled 2017-11-18: qty 6

## 2017-11-18 MED ORDER — IPRATROPIUM BROMIDE 0.02 % IN SOLN
0.5000 mg | Freq: Once | RESPIRATORY_TRACT | Status: AC
  Administered 2017-11-19: 0.5 mg via RESPIRATORY_TRACT
  Filled 2017-11-18: qty 2.5

## 2017-11-18 MED ORDER — ACETAMINOPHEN 325 MG PO TABS
650.0000 mg | ORAL_TABLET | Freq: Once | ORAL | Status: AC
  Administered 2017-11-18: 650 mg via ORAL

## 2017-11-18 NOTE — ED Notes (Signed)
Patient ambulatory to the room, no complaints at this time.  Changing into gown and will be hooked up to monitor after.

## 2017-11-18 NOTE — ED Triage Notes (Signed)
Pt arrives via EMS for chest pain starting after getting home from store today, states increased stress lately. Reports Cough with green sputum. States this happens to her occasionally and takes her albuterol inhaler and feels better. LS clear and equal bilateral. No meds given by EMS.

## 2017-11-18 NOTE — ED Notes (Signed)
EDP at bedside  

## 2017-11-19 MED ORDER — ALBUTEROL SULFATE HFA 108 (90 BASE) MCG/ACT IN AERS
2.0000 | INHALATION_SPRAY | RESPIRATORY_TRACT | Status: DC | PRN
  Administered 2017-11-19: 2 via RESPIRATORY_TRACT

## 2017-11-19 MED ORDER — ALBUTEROL SULFATE (2.5 MG/3ML) 0.083% IN NEBU: 5.0000 mg | INHALATION_SOLUTION | Freq: Four times a day (QID) | RESPIRATORY_TRACT | 0 refills | Status: DC | PRN

## 2017-11-19 NOTE — ED Provider Notes (Signed)
[facility] EMERGENCY DEPARTMENT Provider Note   CSN: [id number] Arrival date & time: 11/18/17  1942     History   Chief Complaint Chief Complaint  Patient presents with  . Chest Pain    HPI Kristin Bentley is a 27 y.o. female.  Patient with a history of asthma, ADHD presents with chest tightness and shortness of breath with cough and without fever that started today. She states she is out of her inhaler, last used one month ago. She has a nebulizer machine but has no medication to use. She feels much better at this time without intervention. She also reports nausea, vomiting and diarrhea similar to illness her child has currently.   The history is provided by the patient. No language interpreter was used.  Chest Pain   Associated symptoms include cough, nausea, shortness of breath and vomiting. Pertinent negatives include no fever.    Past Medical History:  Diagnosis Date  . ADHD (attention deficit hyperactivity disorder)   . Anemia   . Anxiety   . Anxiety with depression   . Asthma   . Chlamydia   . Gonorrhea   . Hypertension     Patient Active Problem List   Diagnosis Date Noted  . Normal labor 04/19/2017  . Pregnancy 04/19/2017  . Supervision of high-risk pregnancy 04/18/2017  . Chlamydia infection affecting pregnancy in third trimester, antepartum 04/15/2017  . Chronic hypertension during pregnancy, antepartum 02/05/2017  . Low vitamin D level 01/02/2017  . Anemia affecting pregnancy 01/02/2017  . Supervision of high risk pregnancy, antepartum 12/26/2016  . Short interval between pregnancies affecting pregnancy, antepartum 12/26/2016  . Late prenatal care affecting pregnancy in second trimester 12/26/2016  . HTN (hypertension) 04/16/2016  . Essential hypertension 03/31/2015  . Obesity 12/14/2014  . Asthma, chronic 12/14/2014  . Metabolic syndrome 12/14/2014  . History of attention deficit hyperactivity disorder (ADHD) 12/14/2014     Past Surgical History:  Procedure Laterality Date  . WISDOM TOOTH EXTRACTION      OB History    Gravida Para Term Preterm AB Living   3 2 2     2    SAB TAB Ectopic Multiple Live Births         0 2       Home Medications    Prior to Admission medications   Medication Sig Start Date End Date Taking? Authorizing Provider  acetaminophen (TYLENOL) 325 MG tablet Take 2 tablets (650 mg total) by mouth every 4 (four) hours as needed (for pain scale < 4). 04/21/17   Lorne Skeens, MD  ferrous sulfate 325 (65 FE) MG tablet Take 325 mg by mouth 2 (two) times daily with a meal.    [provider]  fluticasone (FLOVENT HFA) 110 MCG/ACT inhaler Inhale 2 puffs into the lungs daily. 01/07/17   Orvilla Cornwall A, CNM  ibuprofen (ADVIL,MOTRIN) 600 MG tablet Take 1 tablet (600 mg total) by mouth every 6 (six) hours. 04/21/17   Lorne Skeens, MD  oxyCODONE (OXY IR/ROXICODONE) 5 MG immediate release tablet Take 1 tablet (5 mg total) by mouth every 4 (four) hours as needed (pain scale 4-7). 04/21/17   Lorne Skeens, MD  Prenat-FeAsp-Meth-FA-DHA w/o A (PRENATE PIXIE) 10-0.6-0.4-200 MG CAPS Take 1 tablet by mouth daily. 12/26/16   Orvilla Cornwall A, CNM  sertraline (ZOLOFT) 50 MG tablet Take 1 tablet (50 mg total) by mouth daily. 05/20/17   Constant, Peggy, MD  Vitamin D, Ergocalciferol, (DRISDOL) 50000 units CAPS capsule  TAKE 1 CAPSULE BY MOUTH EVERY 7 DAYS 05/15/17   [provider]    Family History Family History  Problem Relation Age of Onset  . Heart disease Mother   . Diabetes Mother   . Hypertension Mother   . Asthma Sister   . Diabetes Maternal Grandmother     Social History Social History   Tobacco Use  . Smoking status: Former Games developer  . Smokeless tobacco: Former Engineer, water Use Topics  . Alcohol use: No  . Drug use: No     Allergies   Bee venom and Citrus   Review of Systems Review of Systems  Constitutional: Negative  for chills and fever.  HENT: Negative.  Negative for congestion.   Respiratory: Positive for cough, chest tightness, shortness of breath and wheezing.   Gastrointestinal: Positive for diarrhea, nausea and vomiting.  Genitourinary: Negative.   Musculoskeletal: Negative.   Skin: Negative.   Neurological: Negative.      Physical Exam Updated Vital Signs BP 121/67   Pulse 88   Temp 100 F (37.8 C) (Oral)   Resp (!) 21   Ht 5\' 6"  (1.676 m)   Wt 117.9 kg (260 lb)   SpO2 99%   BMI 41.97 kg/m   Physical Exam  Constitutional: She is oriented to person, place, and time. She appears well-developed and well-nourished.  HENT:  Head: Normocephalic.  Neck: Normal range of motion. Neck supple.  Cardiovascular: Normal rate and regular rhythm.  Pulmonary/Chest: Effort normal and breath sounds normal. She has no decreased breath sounds. She has no wheezes. She has no rales.  Abdominal: Soft. Bowel sounds are normal. There is no tenderness. There is no rebound and no guarding.  Musculoskeletal: Normal range of motion.  Neurological: She is alert and oriented to person, place, and time.  Skin: Skin is warm and dry. No rash noted.  Psychiatric: She has a normal mood and affect.     ED Treatments / Results  Labs (all labs ordered are listed, but only abnormal results are displayed) Labs Reviewed  BASIC METABOLIC PANEL - Abnormal; Notable for the following components:      Result Value   Potassium 3.2 (*)    All other components within normal limits  CBC - Abnormal; Notable for the following components:   RBC 5.59 (*)    Hemoglobin 10.1 (*)    HCT 34.6 (*)    MCV 61.9 (*)    MCH 18.1 (*)    MCHC 29.2 (*)    RDW 17.0 (*)    Platelets 448 (*)    All other components within normal limits  I-STAT TROPONIN, ED  POC URINE PREG, ED    EKG  EKG Interpretation None       Radiology Dg Chest 2 View  Result Date: 11/18/2017 CLINICAL DATA:  Chest pain EXAM: CHEST  2 VIEW COMPARISON:   11/26/2013 FINDINGS: The heart size and mediastinal contours are within normal limits. Both lungs are clear. The visualized skeletal structures are unremarkable. IMPRESSION: No active cardiopulmonary disease. Electronically Signed   By: Jasmine Pang M.D.   On: 11/18/2017 20:15    Procedures Procedures (including critical care time)  Medications Ordered in ED Medications  albuterol (PROVENTIL) (2.5 MG/3ML) 0.083% nebulizer solution 5 mg (not administered)  ipratropium (ATROVENT) nebulizer solution 0.5 mg (not administered)  acetaminophen (TYLENOL) tablet 650 mg (650 mg Oral Given 11/18/17 2015)     Initial Impression / Assessment and Plan / ED Course  I have  reviewed the triage vital signs and the nursing notes.  Pertinent labs & imaging results that were available during my care of the patient were reviewed by me and considered in my medical decision making (see chart for details).     Patient with a history of asthma, who is out of her inhaler and nebulizer medication, presents with cough, chest tightness and SOB she reports feels like an asthma problem. She also has nausea, vomiting and diarrhea. No fever.   She is well appearing. Lungs sounds are normal despite continued chest tightness. Labs, including troponin, are normal. CXR clear. EKG shows no ischemia.   Neb treatment ordered and will re-evaluate for improvement. Anticipate d/ch home. No vomiting in ED. No diarrhea.   Patient reports subjective improvement in symptoms. VSS. She can be discharged home with PCP follow up.  Final Clinical Impressions(s) / ED Diagnoses   Final diagnoses:  None   1. Asthma exacerbation.  ED Discharge Orders    None       Elpidio AnisUpstill, Debbe Crumble, PA-C 11/19/17 0057    Rolan BuccoBelfi, Melanie, MD 11/19/17 [phone]

## 2017-11-19 NOTE — ED Notes (Signed)
HR accelerated due to just finishing albuterol treatments. Lungs sound clearer post treatment

## 2017-11-19 NOTE — ED Notes (Signed)
Patient Alert and oriented X4. Stable and ambulatory. Patient verbalized understanding of the discharge instructions.  Patient belongings were taken by the patient.  

## 2017-11-19 NOTE — ED Notes (Signed)
EDP at bedside  

## 2018-01-03 ENCOUNTER — Other Ambulatory Visit: Payer: Self-pay | Admitting: Certified Nurse Midwife

## 2018-01-03 DIAGNOSIS — J4542 Moderate persistent asthma with status asthmaticus: Secondary | ICD-10-CM

## 2018-02-05 ENCOUNTER — Encounter (HOSPITAL_COMMUNITY): Payer: Self-pay | Admitting: Family Medicine

## 2018-02-05 ENCOUNTER — Emergency Department (HOSPITAL_COMMUNITY)
Admission: EM | Admit: 2018-02-05 | Discharge: 2018-02-05 | Disposition: A | Discharge status: Home / Self Care | Payer: Medicaid Other | Attending: Emergency Medicine | Admitting: Emergency Medicine

## 2018-02-05 DIAGNOSIS — I10 Essential (primary) hypertension: Secondary | ICD-10-CM | POA: Diagnosis not present

## 2018-02-05 DIAGNOSIS — B9689 Other specified bacterial agents as the cause of diseases classified elsewhere: Secondary | ICD-10-CM | POA: Insufficient documentation

## 2018-02-05 DIAGNOSIS — H5789 Other specified disorders of eye and adnexa: Secondary | ICD-10-CM | POA: Diagnosis present

## 2018-02-05 DIAGNOSIS — Z79899 Other long term (current) drug therapy: Secondary | ICD-10-CM | POA: Diagnosis not present

## 2018-02-05 DIAGNOSIS — Z87891 Personal history of nicotine dependence: Secondary | ICD-10-CM | POA: Diagnosis not present

## 2018-02-05 DIAGNOSIS — H1032 Unspecified acute conjunctivitis, left eye: Secondary | ICD-10-CM

## 2018-02-05 MED ORDER — OFLOXACIN 0.3 % OP SOLN: 1.0000 [drp] | Freq: Four times a day (QID) | OPHTHALMIC | 0 refills | Status: AC

## 2018-02-05 MED ORDER — ERYTHROMYCIN 5 MG/GM OP OINT: TOPICAL_OINTMENT | Freq: Once | OPHTHALMIC | Status: DC

## 2018-02-05 MED ORDER — TETRACAINE HCL 0.5 % OP SOLN
2.0000 [drp] | Freq: Once | OPHTHALMIC | Status: AC
  Administered 2018-02-05: 2 [drp] via OPHTHALMIC
  Filled 2018-02-05: qty 4

## 2018-02-05 MED ORDER — FLUORESCEIN SODIUM 1 MG OP STRP
1.0000 | ORAL_STRIP | Freq: Once | OPHTHALMIC | Status: AC
  Administered 2018-02-05: 1 via OPHTHALMIC
  Filled 2018-02-05: qty 1

## 2018-02-05 NOTE — ED Triage Notes (Signed)
Patient reports she is experiencing "pink eye" to her left eye. Patient reports she is having white, yellow drainage that has her eye mated shut in the morning. Also, has irration from the sun. Symptoms started 4 days ago.

## 2018-02-05 NOTE — ED Provider Notes (Signed)
[facility]-EMERGENCY DEPT Provider Note   CSN: [id number] Arrival date & time: 02/05/18  1835     History   Chief Complaint Chief Complaint  Patient presents with  . Conjunctivitis    HPI Kristin Bentley is a 27 y.o. female who presents the emergency department today for left eye redness.  Patient reports that over the last 4 days she is started developing eye redness as well as discharge.  She reports that she has a 69-month-old and 19-year-old who have had viral illnesses recently.  She reports over the last 3 days whenever she wakes up her eyes crusted shut.  During the day she has some yellow/white drainage that drains from the eye.  She notes that the eye can be watery at time.  She has not taken anything for symptoms.  She does wear glasses at baseline but did not bring them in today.  She does not wear contacts. Denies fever, HA, N/V, loss of vision, changes in vision, flashers, floaters, blurring, diplopia, photophobia, FB sensation, trauma, rash, pain or painful EOM.   HPI  Past Medical History:  Diagnosis Date  . ADHD (attention deficit hyperactivity disorder)   . Anemia   . Anxiety   . Anxiety with depression   . Asthma   . Chlamydia   . Gonorrhea   . Hypertension     Patient Active Problem List   Diagnosis Date Noted  . Normal labor 04/19/2017  . Pregnancy 04/19/2017  . Supervision of high-risk pregnancy 04/18/2017  . Chlamydia infection affecting pregnancy in third trimester, antepartum 04/15/2017  . Chronic hypertension during pregnancy, antepartum 02/05/2017  . Low vitamin D level 01/02/2017  . Anemia affecting pregnancy 01/02/2017  . Supervision of high risk pregnancy, antepartum 12/26/2016  . Short interval between pregnancies affecting pregnancy, antepartum 12/26/2016  . Late prenatal care affecting pregnancy in second trimester 12/26/2016  . HTN (hypertension) 04/16/2016  . Essential hypertension 03/31/2015  . Obesity 12/14/2014   . Asthma, chronic 12/14/2014  . Metabolic syndrome 12/14/2014  . History of attention deficit hyperactivity disorder (ADHD) 12/14/2014    Past Surgical History:  Procedure Laterality Date  . WISDOM TOOTH EXTRACTION       OB History    Gravida  3   Para  2   Term  2   Preterm      AB      Living  2     SAB      TAB      Ectopic      Multiple  0   Live Births  2            Home Medications    Prior to Admission medications   Medication Sig Start Date End Date Taking? Authorizing Provider  acetaminophen (TYLENOL) 325 MG tablet Take 2 tablets (650 mg total) by mouth every 4 (four) hours as needed (for pain scale < 4). 04/21/17   Lorne Skeens, MD  albuterol (PROVENTIL) (2.5 MG/3ML) 0.083% nebulizer solution Take 6 mLs (5 mg total) by nebulization every 6 (six) hours as needed for wheezing or shortness of breath. 11/19/17   Elpidio Anis, PA-C  FLOVENT HFA 110 MCG/ACT inhaler INHALE TWO PUFFS BY MOUTH DAILY 01/03/18   Brock Bad, MD  sertraline (ZOLOFT) 50 MG tablet Take 1 tablet (50 mg total) by mouth daily. 05/20/17   Constant, Peggy, MD  Vitamin D, Ergocalciferol, (DRISDOL) 50000 units CAPS capsule TAKE 1 CAPSULE BY MOUTH EVERY 7 DAYS 05/15/17  [provider]    Family History Family History  Problem Relation Age of Onset  . Heart disease Mother   . Diabetes Mother   . Hypertension Mother   . Asthma Sister   . Diabetes Maternal Grandmother     Social History Social History   Tobacco Use  . Smoking status: Former Games developer  . Smokeless tobacco: Former Engineer, water Use Topics  . Alcohol use: No  . Drug use: No     Allergies   Bee venom and Citrus   Review of Systems Review of Systems  All other systems reviewed and are negative.    Physical Exam Updated Vital Signs BP 137/83 (BP Location: Right Arm)   Pulse 83   Temp 98.7 F (37.1 C) (Oral)   Resp 20   Ht 5\' 6"  (1.676 m)   SpO2 100%   BMI 41.97 kg/m    Physical Exam  Constitutional: She appears well-developed and well-nourished.  HENT:  Head: Normocephalic and atraumatic.  Right Ear: External ear normal.  Left Ear: External ear normal.  Nose: Nose normal.  Mouth/Throat: Uvula is midline, oropharynx is clear and moist and mucous membranes are normal. No tonsillar exudate.  Eyes: Conjunctivae are normal. Right eye exhibits no discharge. Left eye exhibits no discharge. No scleral icterus.  Slit lamp exam:      The left eye shows no corneal abrasion, no corneal flare, no corneal ulcer, no foreign body, no hyphema, no hypopyon, no fluorescein uptake and no anterior chamber bulge.  Appearance.  Conjunctival erythema noted.  There is some scleral erythema that is limbic sparing.  Minimal watery discharge.  PEERL intact. EOMI without nystagmus or pain. No photophobia or consensual photophobia.  Corneal Abrasion Exam VCO. Risks, benefits and alternatives explained. 1 drops of tetracaine (PONTOCAINE) 0.5 % ophthalmic solution  were applied to the left eye. Fluorescein 1 MG ophthalmic strip applied the the surface of the left eye Wood's lamp used to screen for abrasion. No increased fluorescein uptake. No corneal ulcer. Negative Seidel sign. No foreign bodies noted. No visible hyphema.  Eye flushed with sterile saline Patient tolerated the procedure well  Pulmonary/Chest: Effort normal. No respiratory distress.  Neurological: She is alert.  Skin: No pallor.  Psychiatric: She has a normal mood and affect.  Nursing note and vitals reviewed.   ED Treatments / Results  Labs (all labs ordered are listed, but only abnormal results are displayed) Labs Reviewed - No data to display  EKG None  Radiology No results found.  Procedures Procedures (including critical care time)  Medications Ordered in ED Medications  tetracaine (PONTOCAINE) 0.5 % ophthalmic solution 2 drop (has no administration in time range)  fluorescein ophthalmic strip  1 strip (has no administration in time range)  erythromycin ophthalmic ointment (has no administration in time range)     Initial Impression / Assessment and Plan / ED Course  I have reviewed the triage vital signs and the nursing notes.  Pertinent labs & imaging results that were available during my care of the patient were reviewed by me and considered in my medical decision making (see chart for details).     27 year old female presenting for conjunctivitis. No evidence of HSV or VSV infection. Pt is not a contact lens wearer.  Exam non-concerning for orbital cellulitis, hyphema, corneal ulcers, corneal abrasions or trauma.  Patient will be discharged home with ofloxacin drops with directions to use it every 6 hours over the next 3-5 days.  Patient has  been instructed to use cool compresses and practice personal hygiene with frequent hand washing.  Patient understands to follow up with ophthalmology, especially if new symptoms including change in vision, purulent drainage, or entrapment occur.  Strict return precautions discussed.  Patient appears safe for discharge.  Final Clinical Impressions(s) / ED Diagnoses   Final diagnoses:  Acute bacterial conjunctivitis of left eye    ED Discharge Orders        Ordered    ofloxacin (OCUFLOX) 0.3 % ophthalmic solution  4 times daily     02/05/18 2109       Princella PellegriniMaczis, Terry Bolotin M, PA-C 02/05/18 2109    Linwood DibblesKnapp, Jon, MD 02/05/18 [phone]

## 2018-02-05 NOTE — Discharge Instructions (Signed)
Follow attached handouts.  Follow up with ophthalmology for further evaluation. If you develop worsening or new concerning symptoms you can return to the emergency department for re-evaluation.

## 2018-02-26 ENCOUNTER — Ambulatory Visit (INDEPENDENT_AMBULATORY_CARE_PROVIDER_SITE_OTHER): Payer: Medicaid Other | Admitting: Obstetrics & Gynecology

## 2018-02-26 ENCOUNTER — Encounter: Payer: Self-pay | Admitting: Obstetrics & Gynecology

## 2018-02-26 VITALS — BP 135/92 | HR 108 | Wt 271.6 lb

## 2018-02-26 DIAGNOSIS — Z3046 Encounter for surveillance of implantable subdermal contraceptive: Secondary | ICD-10-CM

## 2018-02-26 NOTE — Patient Instructions (Signed)
Contraception Choices Contraception, also called birth control, refers to methods or devices that prevent pregnancy. Hormonal methods Contraceptive implant A contraceptive implant is a thin, plastic tube that contains a hormone. It is inserted into the upper part of the arm. It can remain in place for up to 3 years. Progestin-only injections Progestin-only injections are injections of progestin, a synthetic form of the hormone progesterone. They are given every 3 months by a health care provider. Birth control pills Birth control pills are pills that contain hormones that prevent pregnancy. They must be taken once a day, preferably at the same time each day. Birth control patch The birth control patch contains hormones that prevent pregnancy. It is placed on the skin and must be changed once a week for three weeks and removed on the fourth week. A prescription is needed to use this method of contraception. Vaginal ring A vaginal ring contains hormones that prevent pregnancy. It is placed in the vagina for three weeks and removed on the fourth week. After that, the process is repeated with a new ring. A prescription is needed to use this method of contraception. Emergency contraceptive Emergency contraceptives prevent pregnancy after unprotected sex. They come in pill form and can be taken up to 5 days after sex. They work best the sooner they are taken after having sex. Most emergency contraceptives are available without a prescription. This method should not be used as your only form of birth control. Barrier methods Female condom A female condom is a thin sheath that is worn over the penis during sex. Condoms keep sperm from going inside a woman's body. They can be used with a spermicide to increase their effectiveness. They should be disposed after a single use. Female condom A female condom is a soft, loose-fitting sheath that is put into the vagina before sex. The condom keeps sperm from going  inside a woman's body. They should be disposed after a single use. Diaphragm A diaphragm is a soft, dome-shaped barrier. It is inserted into the vagina before sex, along with a spermicide. The diaphragm blocks sperm from entering the uterus, and the spermicide kills sperm. A diaphragm should be left in the vagina for 6-8 hours after sex and removed within 24 hours. A diaphragm is prescribed and fitted by a health care provider. A diaphragm should be replaced every 1-2 years, after giving birth, after gaining more than 15 lb (6.8 kg), and after pelvic surgery. Cervical cap A cervical cap is a round, soft latex or plastic cup that fits over the cervix. It is inserted into the vagina before sex, along with spermicide. It blocks sperm from entering the uterus. The cap should be left in place for 6-8 hours after sex and removed within 48 hours. A cervical cap must be prescribed and fitted by a health care provider. It should be replaced every 2 years. Sponge A sponge is a soft, circular piece of polyurethane foam with spermicide on it. The sponge helps block sperm from entering the uterus, and the spermicide kills sperm. To use it, you make it wet and then insert it into the vagina. It should be inserted before sex, left in for at least 6 hours after sex, and removed and thrown away within 30 hours. Spermicides Spermicides are chemicals that kill or block sperm from entering the cervix and uterus. They can come as a cream, jelly, suppository, foam, or tablet. A spermicide should be inserted into the vagina with an applicator at least 10-15 minutes before   sex to allow time for it to work. The process must be repeated every time you have sex. Spermicides do not require a prescription. Intrauterine contraception Intrauterine device (IUD) An IUD is a T-shaped device that is put in a woman's uterus. There are two types:  Hormone IUD.This type contains progestin, a synthetic form of the hormone progesterone. This  type can stay in place for 3-5 years.  Copper IUD.This type is wrapped in copper wire. It can stay in place for 10 years.  Permanent methods of contraception Female tubal ligation In this method, a woman's fallopian tubes are sealed, tied, or blocked during surgery to prevent eggs from traveling to the uterus. Hysteroscopic sterilization In this method, a small, flexible insert is placed into each fallopian tube. The inserts cause scar tissue to form in the fallopian tubes and block them, so sperm cannot reach an egg. The procedure takes about 3 months to be effective. Another form of birth control must be used during those 3 months. Female sterilization This is a procedure to tie off the tubes that carry sperm (vasectomy). After the procedure, the man can still ejaculate fluid (semen). Natural planning methods Natural family planning In this method, a couple does not have sex on days when the woman could become pregnant. Calendar method This means keeping track of the length of each menstrual cycle, identifying the days when pregnancy can happen, and not having sex on those days. Ovulation method In this method, a couple avoids sex during ovulation. Symptothermal method This method involves not having sex during ovulation. The woman typically checks for ovulation by watching changes in her temperature and in the consistency of cervical mucus. Post-ovulation method In this method, a couple waits to have sex until after ovulation. Summary  Contraception, also called birth control, means methods or devices that prevent pregnancy.  Hormonal methods of contraception include implants, injections, pills, patches, vaginal rings, and emergency contraceptives.  Barrier methods of contraception can include female condoms, female condoms, diaphragms, cervical caps, sponges, and spermicides.  There are two types of IUDs (intrauterine devices). An IUD can be put in a woman's uterus to prevent pregnancy  for 3-5 years.  Permanent sterilization can be done through a procedure for males, females, or both.  Natural family planning methods involve not having sex on days when the woman could become pregnant. This information is not intended to replace advice given to you by your health care provider. Make sure you discuss any questions you have with your health care provider. Document Released: 10/15/2005 Document Revised: 11/17/2016 Document Reviewed: 11/17/2016 Elsevier Interactive Patient Education  2018 Elsevier Inc.  

## 2018-02-26 NOTE — Progress Notes (Signed)
GYNECOLOGY OFFICE PROCEDURE NOTE  Kristin Bentley is a 27 y.o. [id number] here for Nexplanon removal.  Last pap smear was on 11/2016 and was normal.  No other gynecologic concerns.She is concerned that the Nexplanon has affected her weight and asthma although I demonstrated that her weight was stable and discussed that removal is not likely to affect her asthma.   Nexplanon Removal Patient identified, informed consent performed, consent signed.   Appropriate time out taken. Nexplanon site identified.  Area prepped in usual sterile fashon. One ml of 1% lidocaine was used to anesthetize the area at the distal end of the implant. A small stab incision was made right beside the implant on the distal portion.  The Nexplanon rod was grasped using hemostats and removed without difficulty.  There was minimal blood loss. There were no complications.  3 ml of 1% lidocaine was injected around the incision for post-procedure analgesia.  Steri-strips were applied over the small incision.  A pressure bandage was applied to reduce any bruising.  The patient tolerated the procedure well and was given post procedure instructions.  Patient is planning to use condoms for contraception.      Adam Phenix, MD Attending Obstetrician & Gynecologist, [facility]  02/26/2018

## 2018-02-28 ENCOUNTER — Encounter: Payer: Self-pay | Admitting: *Deleted

## 2018-03-27 ENCOUNTER — Encounter (HOSPITAL_COMMUNITY): Payer: Self-pay

## 2018-03-27 ENCOUNTER — Other Ambulatory Visit: Payer: Self-pay

## 2018-03-27 ENCOUNTER — Emergency Department (HOSPITAL_COMMUNITY)
Admission: EM | Admit: 2018-03-27 | Discharge: 2018-03-27 | Disposition: A | Discharge status: Home / Self Care | Payer: Medicaid Other | Attending: Emergency Medicine | Admitting: Emergency Medicine

## 2018-03-27 DIAGNOSIS — Z23 Encounter for immunization: Secondary | ICD-10-CM | POA: Diagnosis not present

## 2018-03-27 DIAGNOSIS — N764 Abscess of vulva: Secondary | ICD-10-CM | POA: Insufficient documentation

## 2018-03-27 DIAGNOSIS — L0291 Cutaneous abscess, unspecified: Secondary | ICD-10-CM

## 2018-03-27 MED ORDER — TETANUS-DIPHTH-ACELL PERTUSSIS 5-2.5-18.5 LF-MCG/0.5 IM SUSP
0.5000 mL | Freq: Once | INTRAMUSCULAR | Status: AC
  Administered 2018-03-27: 0.5 mL via INTRAMUSCULAR
  Filled 2018-03-27: qty 0.5

## 2018-03-27 MED ORDER — LIDOCAINE-EPINEPHRINE 2 %-1:200000 IJ SOLN
10.0000 mL | Freq: Once | INTRAMUSCULAR | Status: AC
Start: 2018-03-27 — End: 2018-03-27
  Administered 2018-03-27: 10 mL
  Filled 2018-03-27: qty 20

## 2018-03-27 MED ORDER — DOXYCYCLINE HYCLATE 100 MG PO CAPS: 100.0000 mg | ORAL_CAPSULE | Freq: Two times a day (BID) | ORAL | 0 refills | Status: DC

## 2018-03-27 MED ORDER — IBUPROFEN 600 MG PO TABS: 600.0000 mg | ORAL_TABLET | Freq: Four times a day (QID) | ORAL | 0 refills | Status: DC | PRN

## 2018-03-27 MED ORDER — IBUPROFEN 400 MG PO TABS
600.0000 mg | ORAL_TABLET | Freq: Once | ORAL | Status: DC
Start: 2018-03-27 — End: 2018-03-27

## 2018-03-27 NOTE — ED Triage Notes (Signed)
Pt states she has a boil or abscess from her vaginal area. She states that she has noted a smell and some drainage especially after showering.

## 2018-03-27 NOTE — Discharge Instructions (Signed)
Please take entire course of antibiotics as directed.  Ibuprofen or Tylenol as needed for pain.  You can also put ice over the area.  I encourage you to use warm soaks a few times a day to help promote drainage.  You should follow-up in 2 days with your primary care doctor, or return to the ED for a wound check to make sure things are improving and abscess is not reforming.  If you have worsening pain, increasing drainage, spreading redness or fevers please return to the ED for sooner evaluation.

## 2018-03-27 NOTE — ED Provider Notes (Signed)
[facility] EMERGENCY DEPARTMENT Provider Note   CSN: [id number] Arrival date & time: 03/27/18  1227     History   Chief Complaint Chief Complaint  Patient presents with  . Abscess    HPI Kristin Bentley is a 27 y.o. female.  Kristin Bentley is a 27 y.o. Female with history of hypertension, asthma, anxiety and ADHD, who presents to the ED for evaluation of a vaginal abscess.  Patient reports red bump over the outside of her right labia which has been progressively worsening over the past 2 weeks, started as a smaller bump but has become increasingly more swollen and painful.  Patient reports it may have drained a little bit in the shower, she reports the only other time she had a similar abscess was during pregnancy.  Patient denies any fevers or chills, no nausea or vomiting.  No history of diabetes.  Patient is not on any blood thinners.  She does not think her tetanus vaccine is up-to-date.      Past Medical History:  Diagnosis Date  . ADHD (attention deficit hyperactivity disorder)   . Anemia   . Anxiety   . Anxiety with depression   . Asthma   . Chlamydia   . Gonorrhea   . Hypertension     Patient Active Problem List   Diagnosis Date Noted  . Low vitamin D level 01/02/2017  . HTN (hypertension) 04/16/2016  . Essential hypertension 03/31/2015  . Morbid obesity (HCC) 12/14/2014  . Asthma, chronic 12/14/2014  . Metabolic syndrome 12/14/2014  . History of attention deficit hyperactivity disorder (ADHD) 12/14/2014    Past Surgical History:  Procedure Laterality Date  . WISDOM TOOTH EXTRACTION       OB History    Gravida  3   Para  2   Term  2   Preterm      AB      Living  2     SAB      TAB      Ectopic      Multiple  0   Live Births  2            Home Medications    Prior to Admission medications   Medication Sig Start Date End Date Taking? Authorizing Provider  acetaminophen (TYLENOL) 325 MG tablet  Take 2 tablets (650 mg total) by mouth every 4 (four) hours as needed (for pain scale < 4). Patient not taking: Reported on 02/26/2018 04/21/17   Lorne Skeens, MD  albuterol (PROVENTIL) (2.5 MG/3ML) 0.083% nebulizer solution Take 6 mLs (5 mg total) by nebulization every 6 (six) hours as needed for wheezing or shortness of breath. 11/19/17   Elpidio Anis, PA-C  FLOVENT HFA 110 MCG/ACT inhaler INHALE TWO PUFFS BY MOUTH DAILY 01/03/18   Brock Bad, MD  sertraline (ZOLOFT) 50 MG tablet Take 1 tablet (50 mg total) by mouth daily. 05/20/17   Constant, Peggy, MD  Vitamin D, Ergocalciferol, (DRISDOL) 50000 units CAPS capsule TAKE 1 CAPSULE BY MOUTH EVERY 7 DAYS 05/15/17   [provider]    Family History Family History  Problem Relation Age of Onset  . Heart disease Mother   . Diabetes Mother   . Hypertension Mother   . Asthma Sister   . Diabetes Maternal Grandmother     Social History Social History   Tobacco Use  . Smoking status: Former Games developer  . Smokeless tobacco: Former Engineer, water Use Topics  . Alcohol use:  No  . Drug use: No     Allergies   Bee venom and Citrus   Review of Systems Review of Systems  Constitutional: Negative for chills and fever.  Gastrointestinal: Negative for abdominal pain, nausea and vomiting.  Genitourinary: Negative for dysuria, frequency, pelvic pain, vaginal bleeding, vaginal discharge and vaginal pain.  Skin: Positive for color change. Negative for rash and wound.       Abscess     Physical Exam Updated Vital Signs BP 137/81 (BP Location: Right Arm)   Pulse 100   Temp 98.1 F (36.7 C) (Oral)   Resp 20   SpO2 100%   Physical Exam  Constitutional: She appears well-developed and well-nourished. No distress.  HENT:  Head: Normocephalic and atraumatic.  Eyes: Right eye exhibits no discharge. Left eye exhibits no discharge.  Pulmonary/Chest: Effort normal. No respiratory distress.  Abdominal: Soft. Bowel sounds  are normal. She exhibits no distension and no mass. There is no tenderness. There is no guarding.  Genitourinary:  Genitourinary Comments: 2 x 2 centimeter nondraining erythematous, fluctuant abscess on the anterior aspect of the right vulva and mons pubis, there is some surrounding cellulitis and induration  Neurological: She is alert. Coordination normal.  Skin: Skin is warm and dry. Capillary refill takes less than 2 seconds. She is not diaphoretic.  Psychiatric: She has a normal mood and affect. Her behavior is normal.  Nursing note and vitals reviewed.    ED Treatments / Results  Labs (all labs ordered are listed, but only abnormal results are displayed) Labs Reviewed - No data to display  EKG None  Radiology No results found.  Procedures .Marland KitchenIncision and Drainage Date/Time: 03/27/2018 2:36 PM Performed by: Dartha Lodge, PA-C Authorized by: Dartha Lodge, PA-C   Consent:    Consent obtained:  Verbal   Consent given by:  Patient   Risks discussed:  Bleeding, incomplete drainage and infection   Alternatives discussed:  No treatment Location:    Type:  Abscess   Size:  2 cm   Location:  Anogenital   Anogenital location:  Vulva Pre-procedure details:    Skin preparation:  Chloraprep Anesthesia (see MAR for exact dosages):    Anesthesia method:  Local infiltration   Local anesthetic:  Lidocaine 2% WITH epi Procedure type:    Complexity:  Simple Procedure details:    Incision types:  Single straight   Incision depth:  Dermal   Scalpel blade:  11   Wound management:  Probed and deloculated and irrigated with saline   Drainage:  Bloody and purulent   Drainage amount:  Moderate   Wound treatment:  Wound left open Post-procedure details:    Patient tolerance of procedure:  Tolerated well, no immediate complications   (including critical care time)  Medications Ordered in ED Medications  ibuprofen (ADVIL,MOTRIN) tablet 600 mg (has no administration in time range)    lidocaine-EPINEPHrine (XYLOCAINE W/EPI) 2 %-1:200000 (PF) injection 10 mL (10 mLs Infiltration Given by Other 03/27/18 1347)  Tdap (BOOSTRIX) injection 0.5 mL (0.5 mLs Intramuscular Given 03/27/18 1345)     Initial Impression / Assessment and Plan / ED Course  I have reviewed the triage vital signs and the nursing notes.  Pertinent labs & imaging results that were available during my care of the patient were reviewed by me and considered in my medical decision making (see chart for details).  Patient with skin abscess over the mons and right labia amenable to incision and drainage.  Abscess was  not large enough to warrant packing or drain,  wound recheck in 2 days. Encouraged home warm soaks and flushing.  Mild signs of cellulitis is surrounding skin given location will treat with antibiotics.  Will d/c to home.  Patient to follow-up for wound check in 2 days, return precautions discussed.   Final Clinical Impressions(s) / ED Diagnoses   Final diagnoses:  Abscess    ED Discharge Orders        Ordered    doxycycline (VIBRAMYCIN) 100 MG capsule  2 times daily     03/27/18 1442    ibuprofen (ADVIL,MOTRIN) 600 MG tablet  Every 6 hours PRN     03/27/18 1442       Dartha Lodge, [place] 03/27/18 1454    Tegeler, Canary Brim, MD 03/27/18 [phone]

## 2018-03-27 NOTE — ED Provider Notes (Signed)
Patient placed in Quick Look pathway, seen and evaluated   Chief Complaint: Abscess  HPI:   2-week history of vaginal abscess.  Possible draining.  Progressively more painful and swollen.  Has not taken anything for it.  No history of diabetes.  No fevers.  She is not on blood thinners.  Tetanus is not up-to-date.  ROS: Vaginal abscess  Physical Exam:   Gen: No distress  Neuro: Awake and Alert  Skin: Warm    Focused Exam: Chaperone present.  Small nondraining abscess on the anterior aspect of the right vulva.  No systemic infection  Will order I&D meds, tetanus   Initiation of care has begun. The patient has been counseled on the process, plan, and necessity for staying for the completion/evaluation, and the remainder of the medical screening examination    Alveria Apley, PA-C 03/27/18 1328    Arby Barrette, MD 04/04/18 1521

## 2018-04-11 NOTE — Telephone Encounter (Signed)
Preadmission screen  

## 2018-08-31 IMAGING — US US MFM OB FOLLOW-UP
1 series · 14 of 28 positions shown · non-contrast
Comparison: none

[Series 1: us mfm ob follow-up · 14 of 37 slices shown]
[im 2/37]
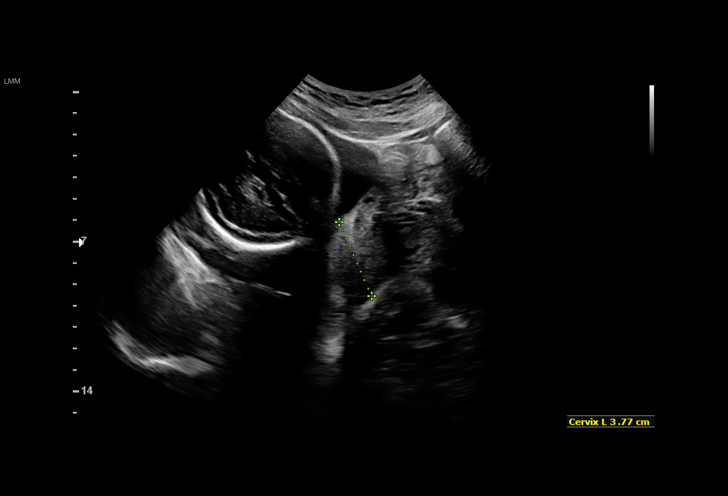
[im 5/37]
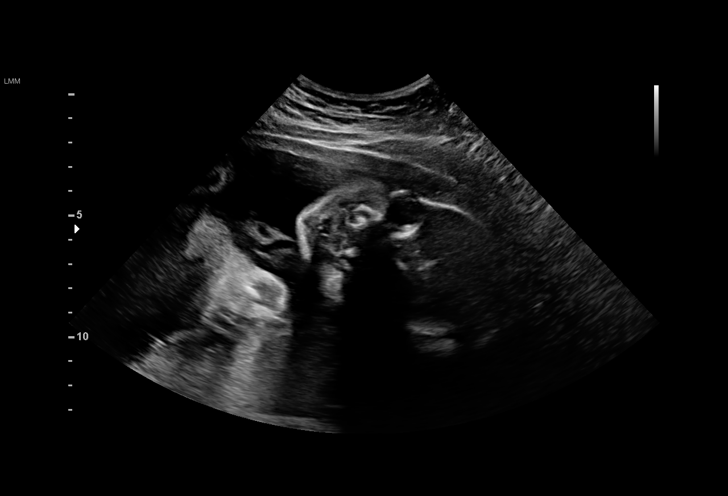
[im 7/37]
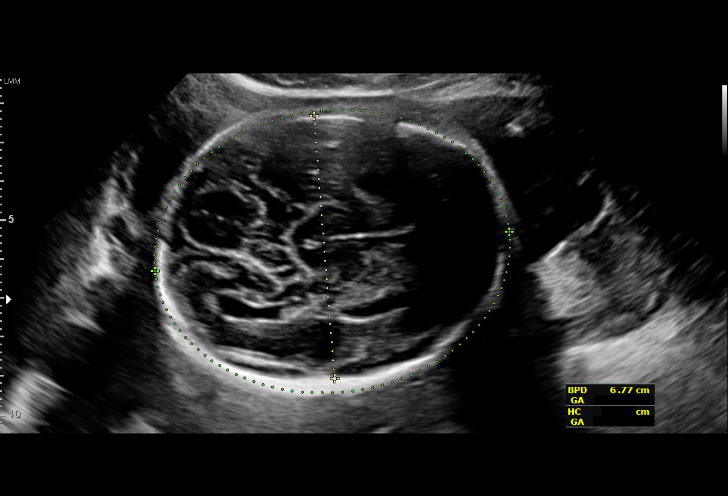
[im 10/37]
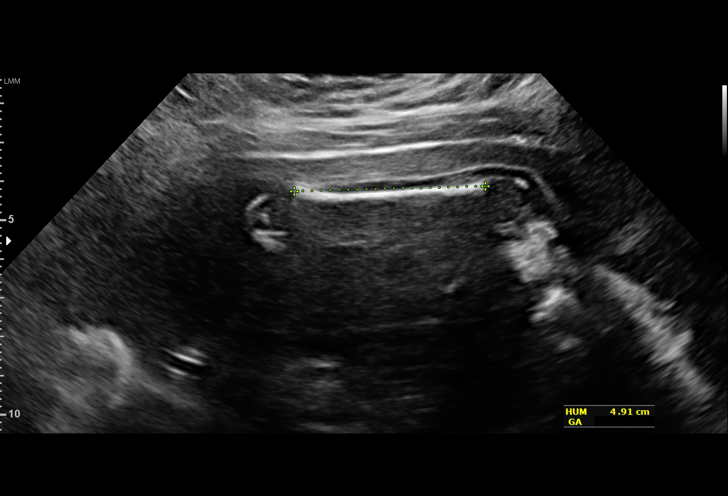
[im 13/37]
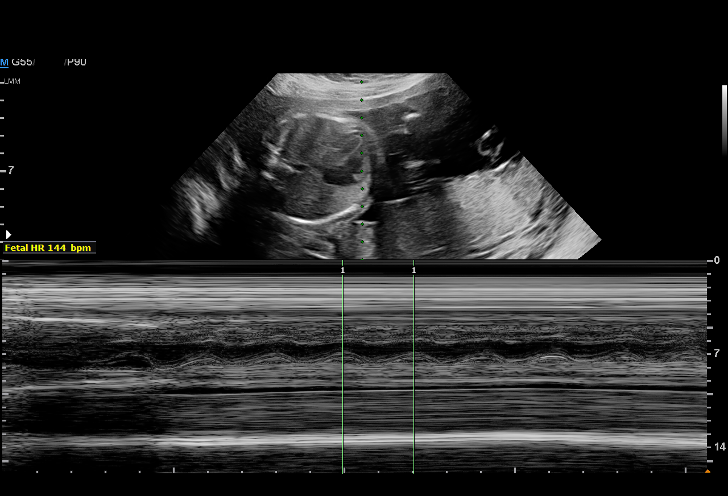
[im 15/37]
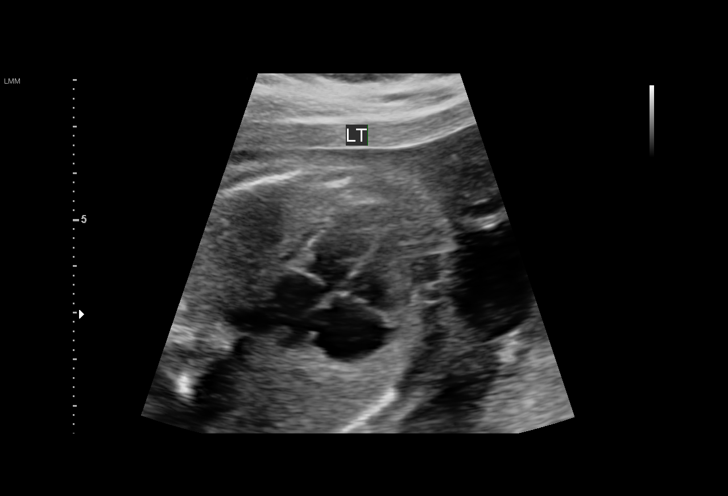
[im 18/37]
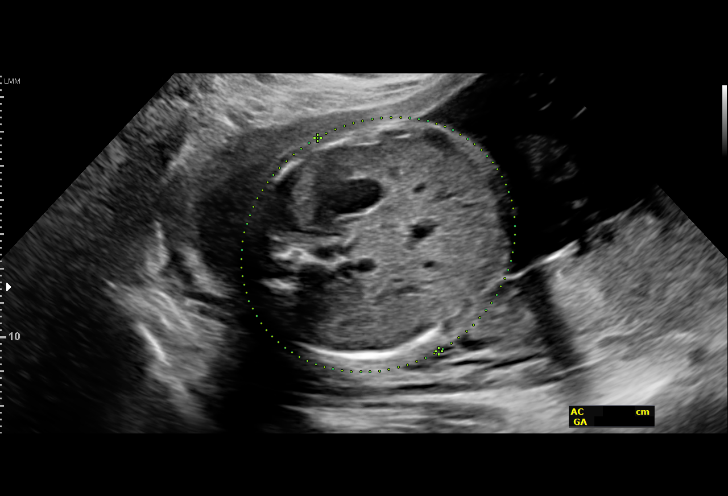
[im 21/37]
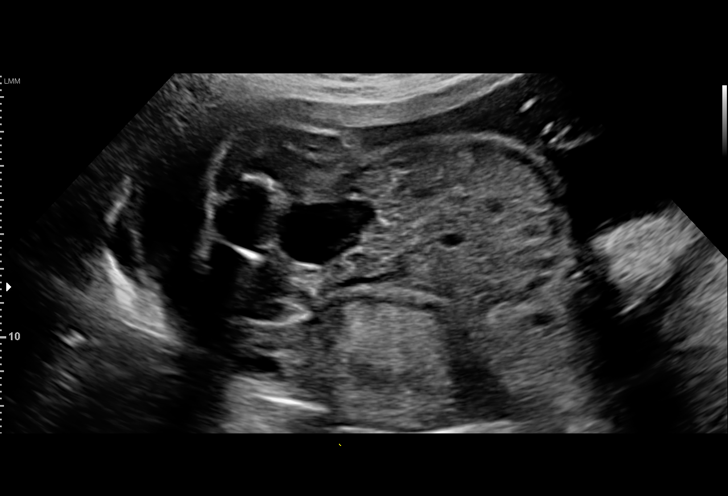
[im 23/37]
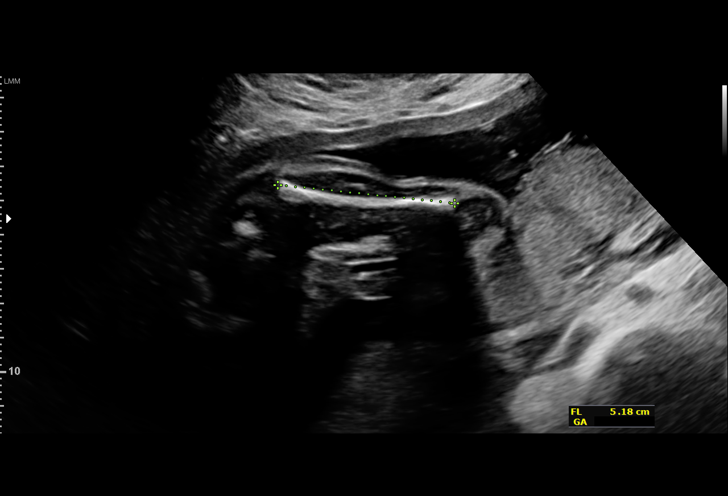
[im 26/37]
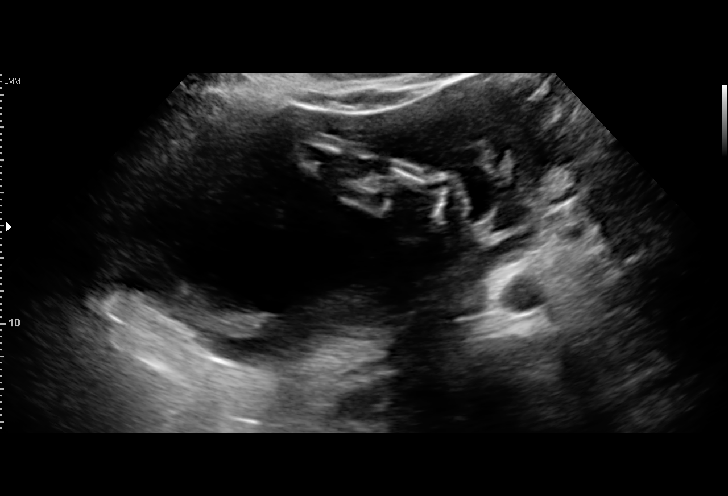
[im 29/37]
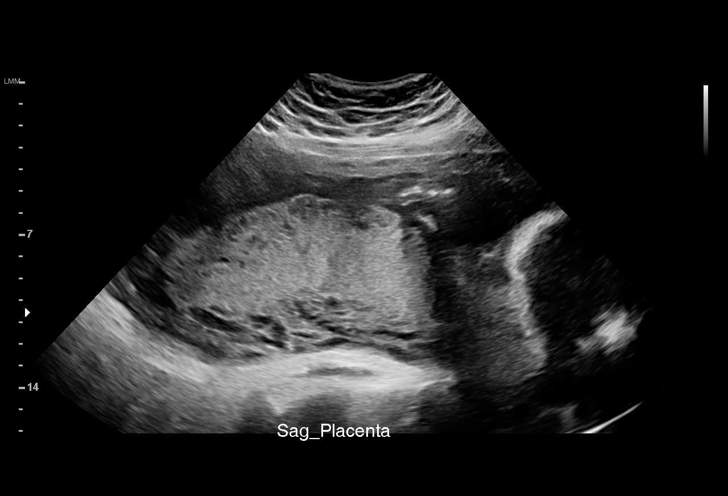
[im 31/37]
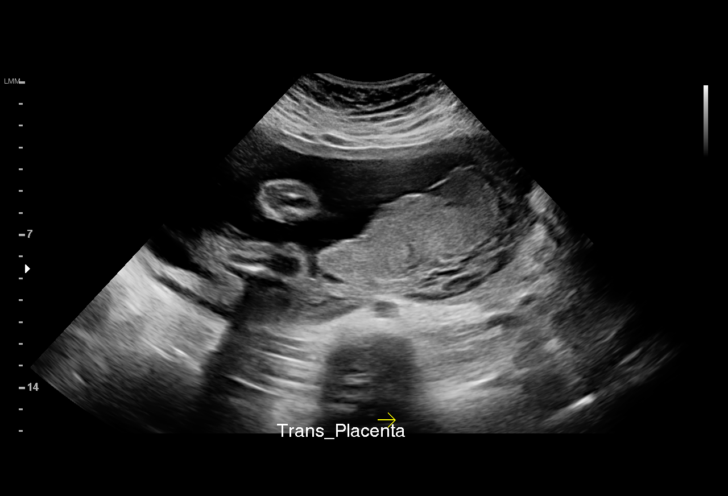
[im 34/37]
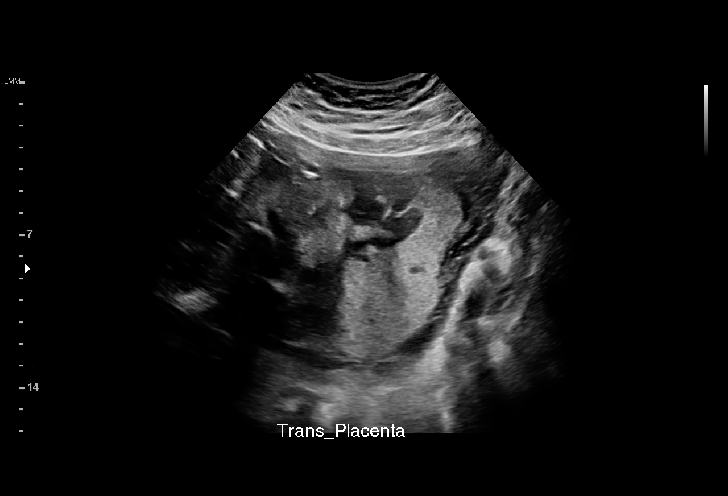
[im 37/37]
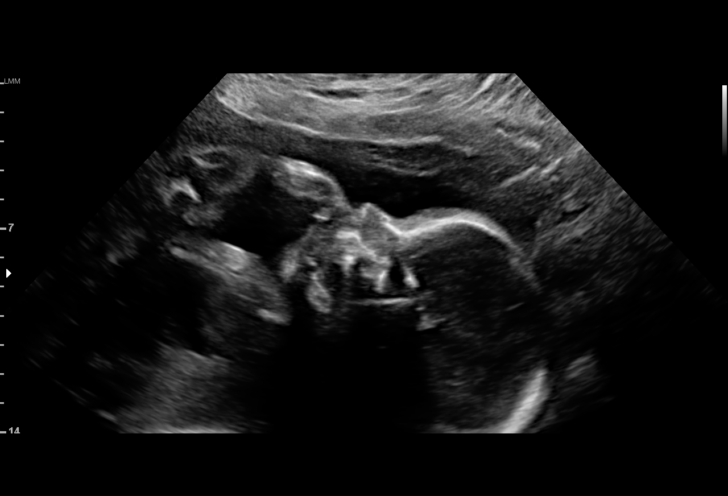

[14 of 28 positions shown; findings below may reference images not displayed]

Road [HOSPITAL]

1  TANEFO AKEH            [id number]      [id number]     [id number]
Indications

26 weeks gestation of pregnancy
Hypertension - Chronic/Pre-existing
Obesity complicating pregnancy, second
trimester
OB History

Blood Type:            Height:         Weight (lb):  260.2    BMI:
Gravidity:    2         Term:   1        Prem:   0        SAB:   0
TOP:          0       Ectopic:  0        Living: 1
Fetal Evaluation

Num Of Fetuses:     1
Fetal Heart         144
Rate(bpm):
Cardiac Activity:   Observed
Presentation:       Cephalic
Placenta:           Posterior, above cervical os
P. Cord Insertion:  Previously Visualized

Amniotic Fluid
AFI FV:      Subjectively within normal limits

Largest Pocket(cm)
7.22
Biometry

BPD:      67.5  mm     G. Age:  27w 1d         79  %    CI:         68.4   %   70 - 86
FL/HC:      19.8   %   18.6 -
HC:      260.9  mm     G. Age:  28w 3d         92  %    HC/AC:      1.10       1.04 -
AC:       237   mm     G. Age:  28w 0d         91  %    FL/BPD:     76.6   %   71 - 87
FL:       51.7  mm     G. Age:  27w 4d         81  %    FL/AC:      21.8   %   20 - 24
HUM:      49.3  mm     G. Age:  29w 0d       > 95  %

Est. FW:    5594  gm      2 lb 8 oz     81  %
Gestational Age

LMP:           26w 0d       Date:   07/27/16                 EDD:   05/03/17
U/S Today:     27w 6d                                        EDD:   04/20/17
Best:          26w 0d    Det. By:   LMP  (07/27/16)          EDD:   05/03/17
Anatomy

Cranium:               Appears normal         Aortic Arch:            Previously seen
Cavum:                 Appears normal         Ductal Arch:            Previously seen
Ventricles:            Appears normal         Diaphragm:              Previously seen
Choroid Plexus:        Previously seen        Stomach:                Appears normal, left
sided
Cerebellum:            Previously seen        Abdomen:                Appears normal
Posterior Fossa:       Previously seen        Abdominal Wall:         Previously seen
Nuchal Fold:           Not applicable (>20    Cord Vessels:           Previously seen
wks GA)
Face:                  Orbits and profile     Kidneys:                Appear normal
previously seen
Lips:                  Previously seen        Bladder:                Appears normal
Thoracic:              Appears normal         Spine:                  Previously seen
Heart:                 Appears normal         Upper Extremities:      Previously seen
(4CH, axis, and situs
RVOT:                  Previously seen        Lower Extremities:      Previously seen
LVOT:                  Previously seen

Other:  Fetus appears to be a male. Nasal bone previously visualized. Heels
previously visualized. Technically difficult due to maternal habitus and
fetal position.
Cervix Uterus Adnexa

Cervix
Length:           3.95  cm.
Normal appearance by transabdominal scan.

Uterus
No abnormality visualized.
Impression

Single IUP at 26w 0d
Chronic hypertension, Obesity
The estimated fetal weight is at the 81st %tile
Posterior placenta without previa
Normal amniotic fluid volume
Recommendations

Recommend follow-up ultrasound examination in 4 weeks for
interval growth

## 2018-09-21 IMAGING — CT CT HEAD W/O CM
2 series · 16 of 30 positions shown, 20 images · non-contrast
Comparison: Facial CT 01/04/2014

CLINICAL DATA: Syncopal episode.  Fell and hit head.

EXAM:
CT HEAD WITHOUT CONTRAST
TECHNIQUE: Contiguous axial images were obtained from the base of the skull
through the vertex without intravenous contrast.

[Series 2: routine head w/o · axial · non-contrast · 0.45mm/px · z∈[+4,+143]mm · 13 of 34 slices shown, 17 images]
[im 3/34  brain]
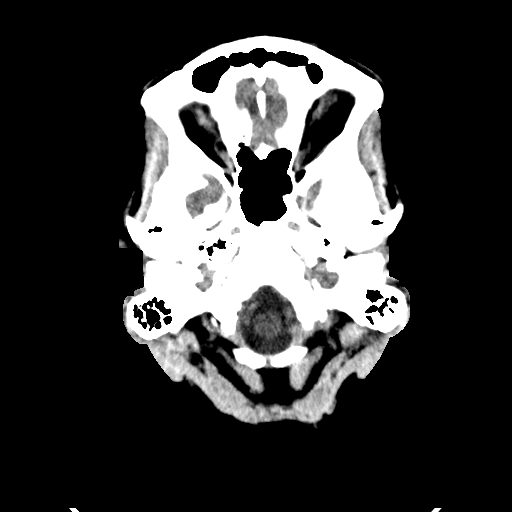
[im 3/34  bone]
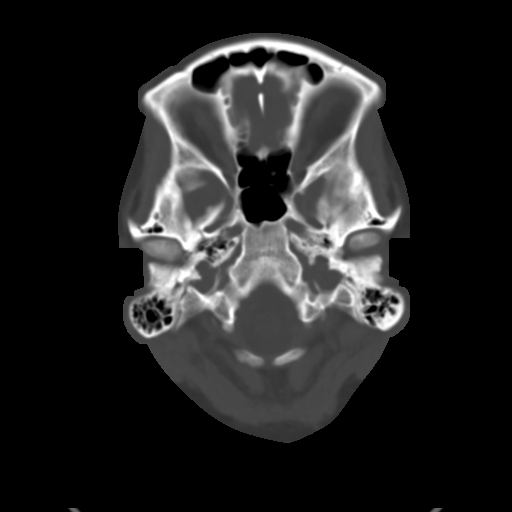
[im 5/34  brain]
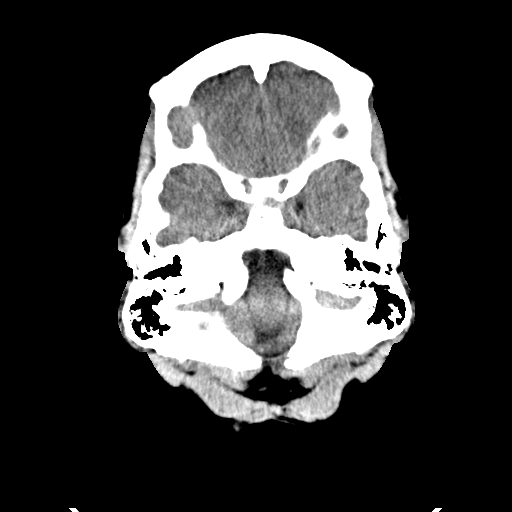
[im 8/34  brain]
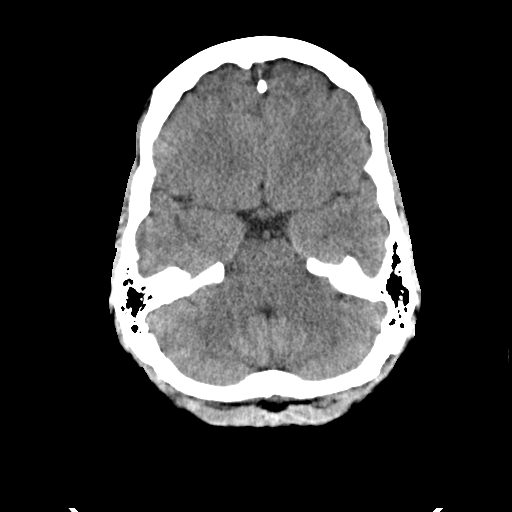
[im 10/34  brain]
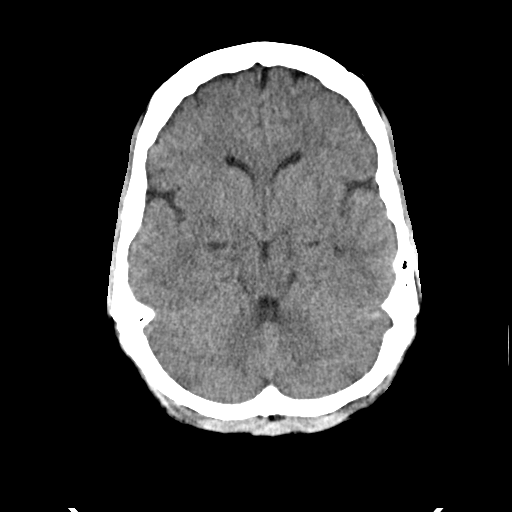
[im 12/34  brain]
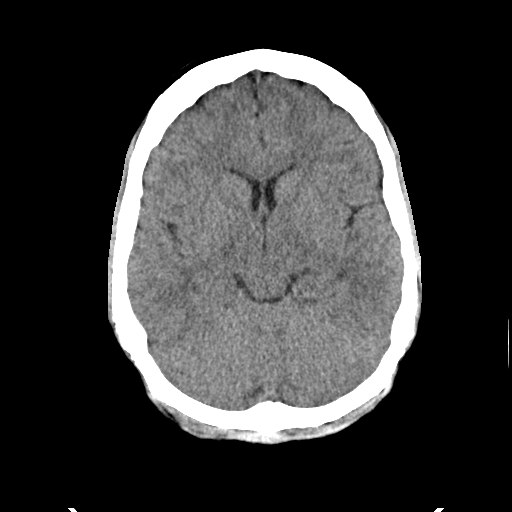
[im 12/34  bone]
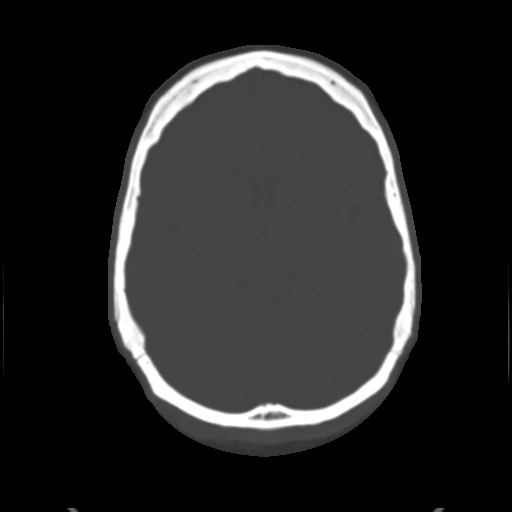
[im 15/34  brain]
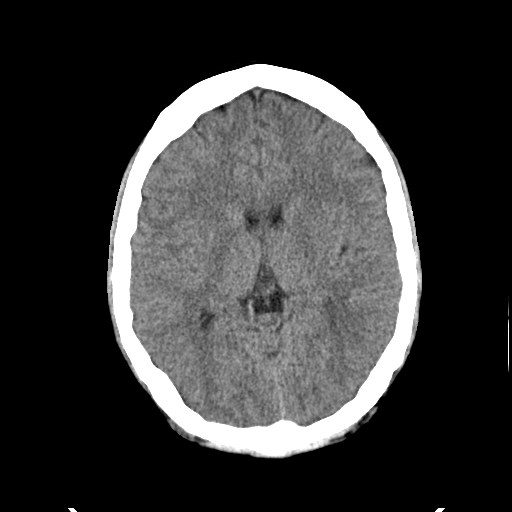
[im 17/34  brain]
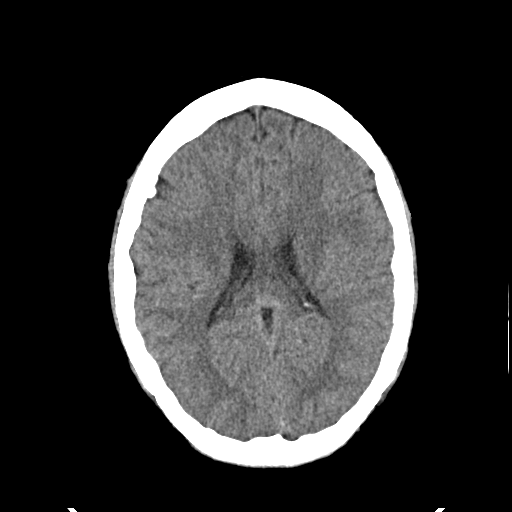
[im 19/34  brain]
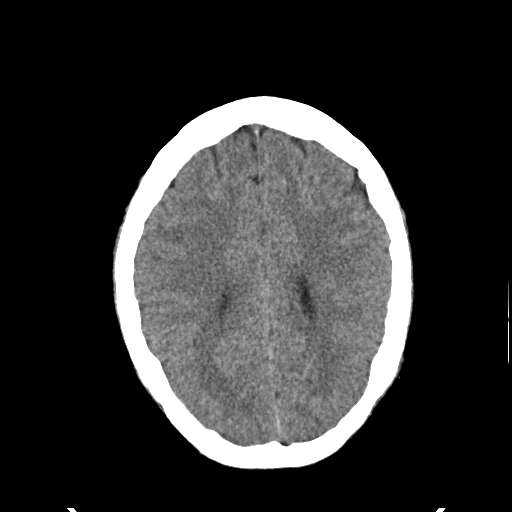
[im 22/34  brain]
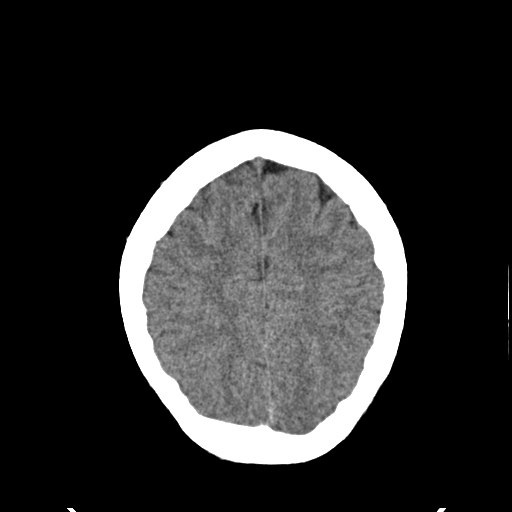
[im 22/34  bone]
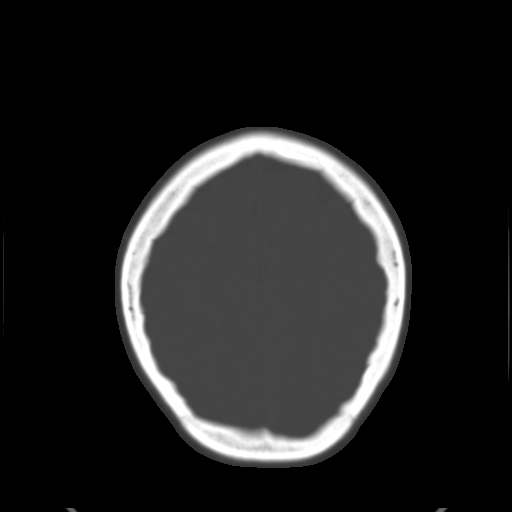
[im 24/34  brain]
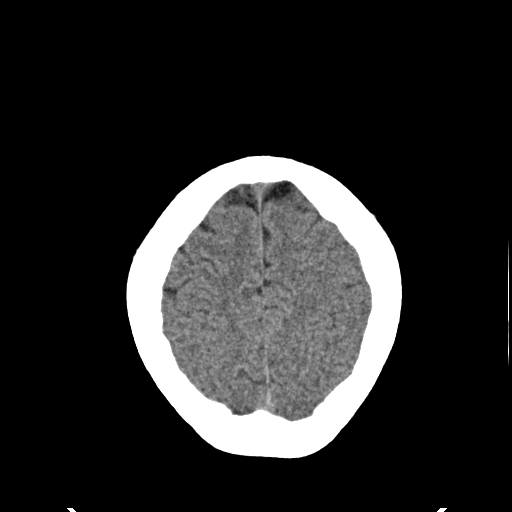
[im 26/34  brain]
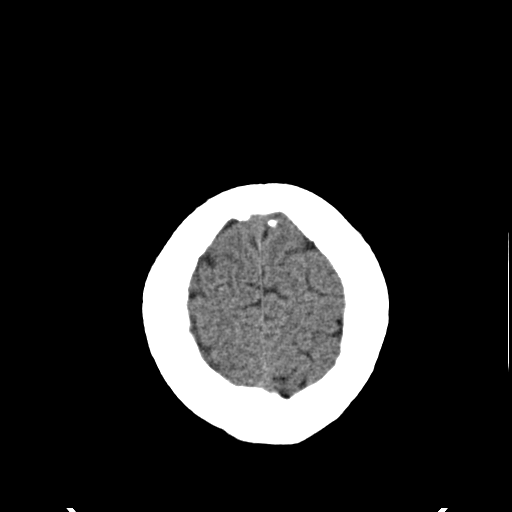
[im 29/34  brain]
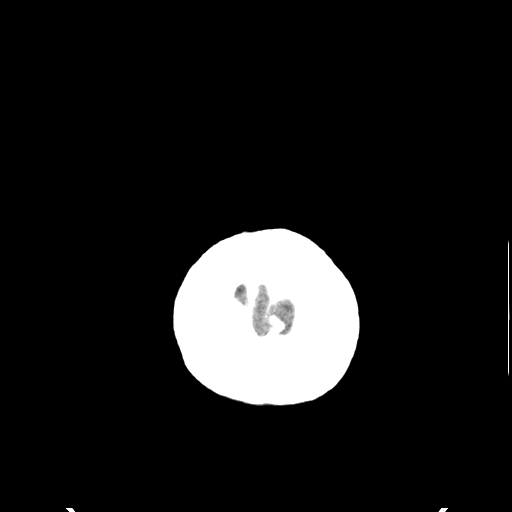
[im 31/34  brain]
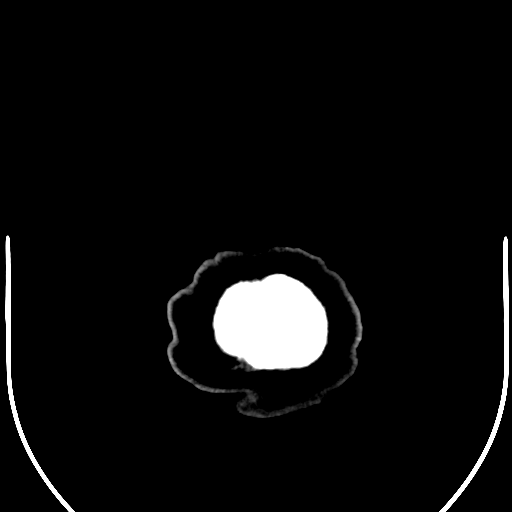
[im 31/34  bone]
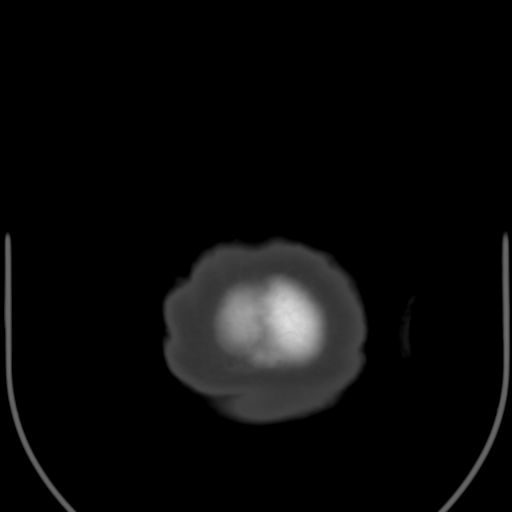

[Series 4: routine head bone · axial · 0.45mm/px · z∈[+4,+49]mm · 3 of 34 slices shown]
[im 3/34  bone]
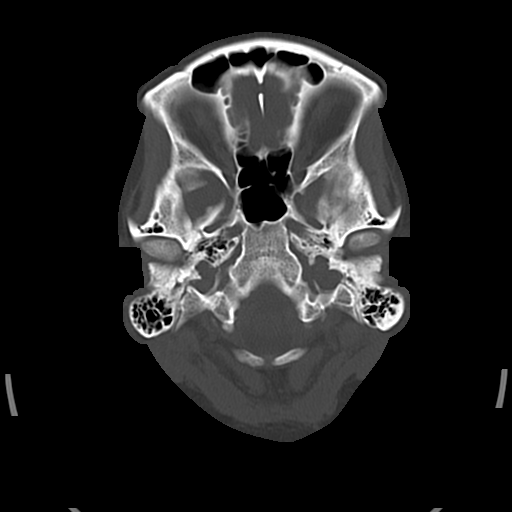
[im 8/34  bone]
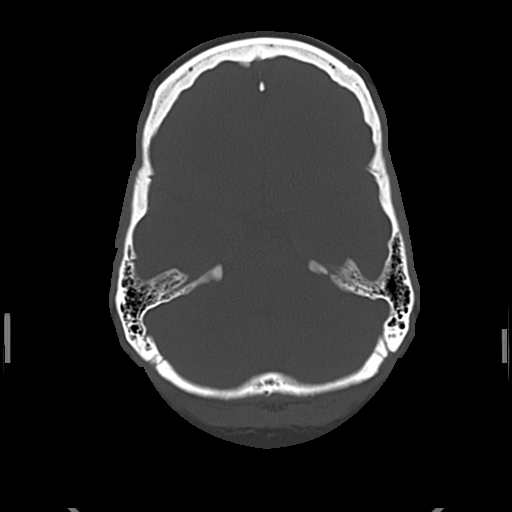
[im 12/34  bone]
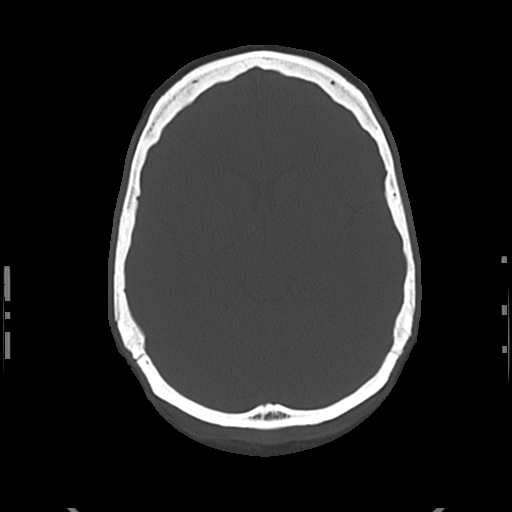

[16 of 30 positions shown; findings below may reference images not displayed]

FINDINGS: Brain:

The ventricles are normal in size and configuration. No extra-axial
fluid collections are identified. The gray-white differentiation is
normal. No CT findings for acute intracranial process such as
hemorrhage or infarction. No mass lesions. The brainstem and
cerebellum are grossly normal.

Vascular: No hyperdense vessels or worrisome calcifications.

Skull: No skull fracture.

Sinuses/Orbits: The paranasal sinuses and mastoid air cells are
clear. The globes are intact.

Other: No scalp hematoma or laceration.
IMPRESSION: No acute intracranial findings or skull fracture.

## 2018-11-06 IMAGING — US US MFM OB FOLLOW-UP
1 series · 14 of 28 positions shown · non-contrast
Comparison: none

[Series 1: us mfm ob follow-up · 37 acquisitions, 14 frames shown]
[im 2/37]
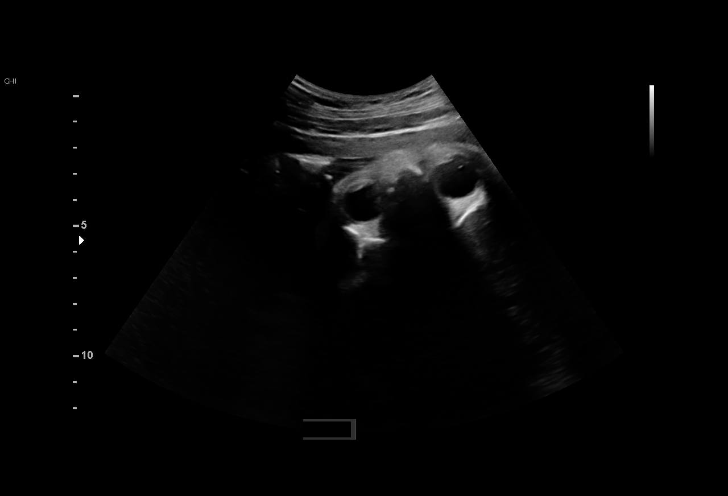
[im 5/37]
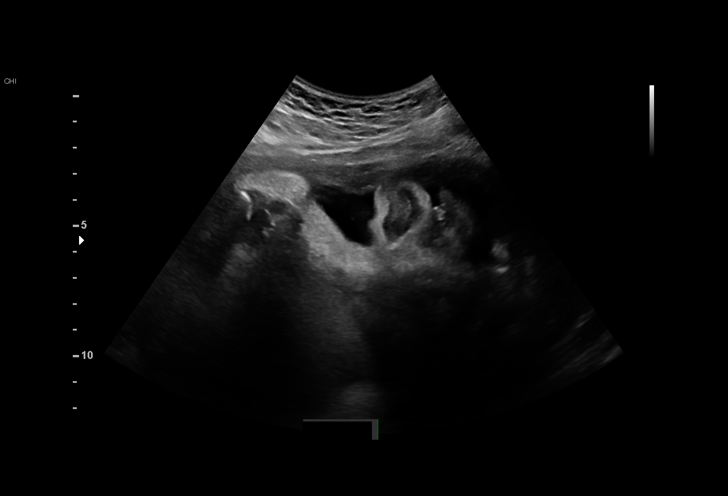
[im 7/37]
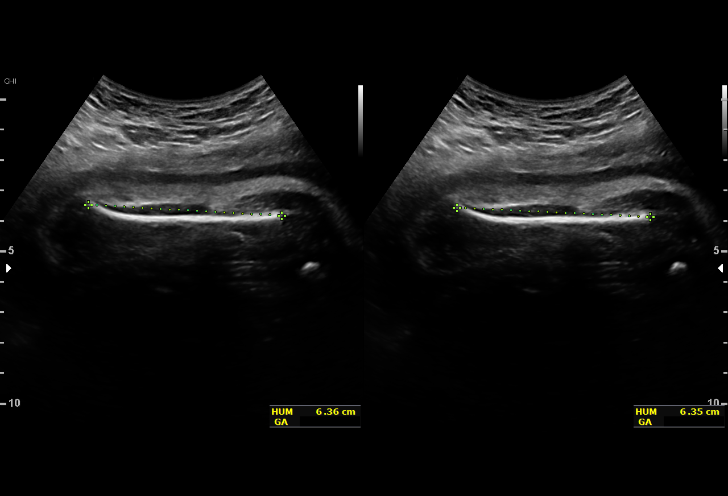
[im 10/37]
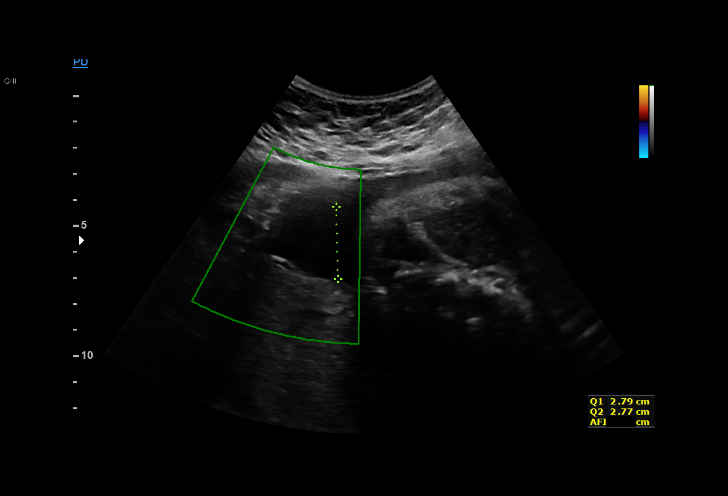
[im 13/37]
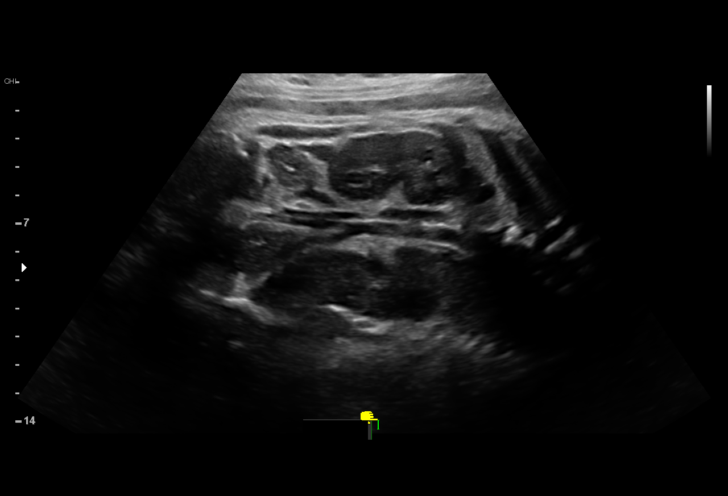
[im 15/37]
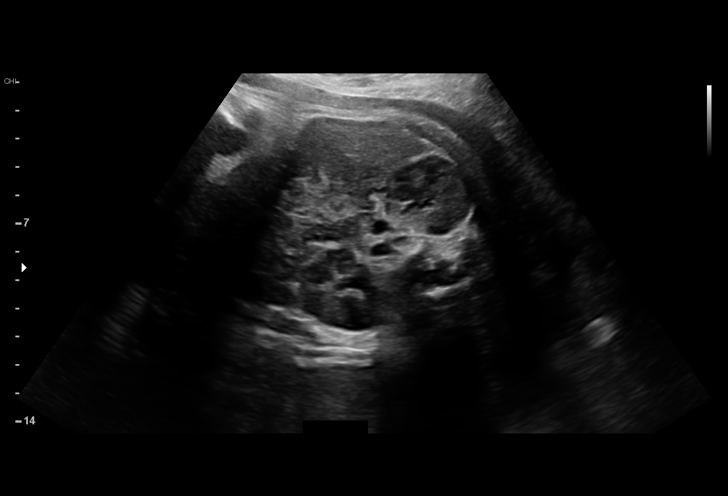
[im 18/37]
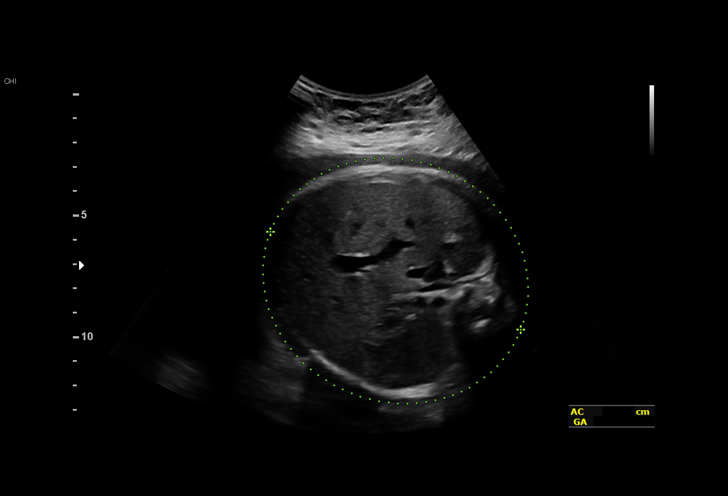
[im 21/37]
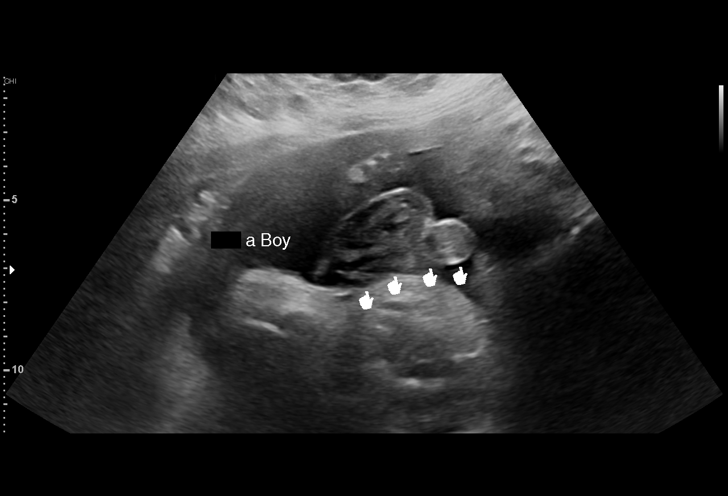
[im 23/37]
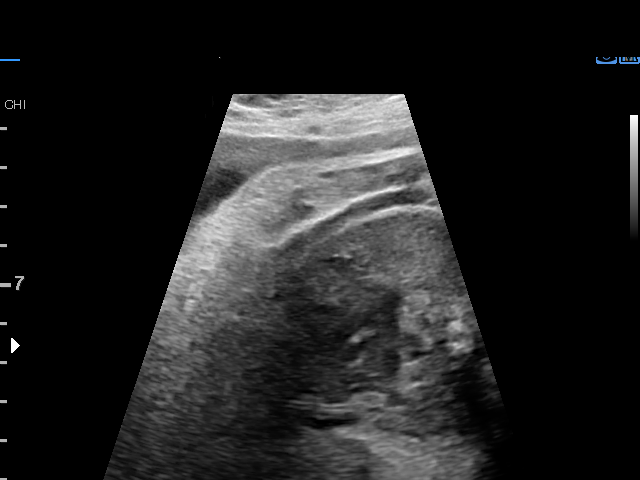
[im 26/37]
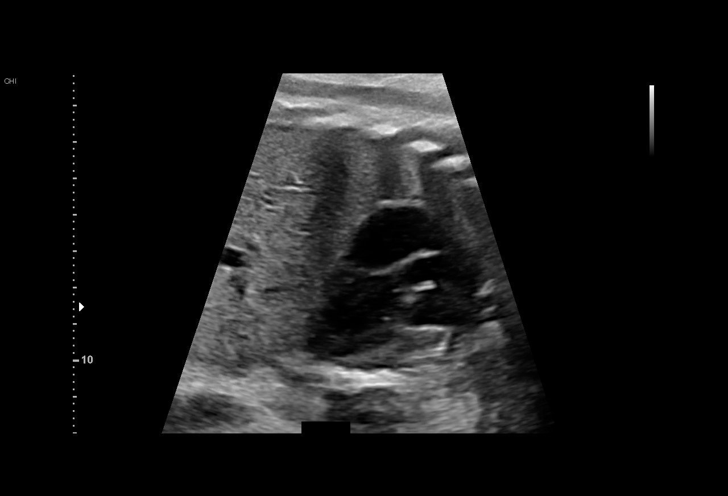
[im 29/37]
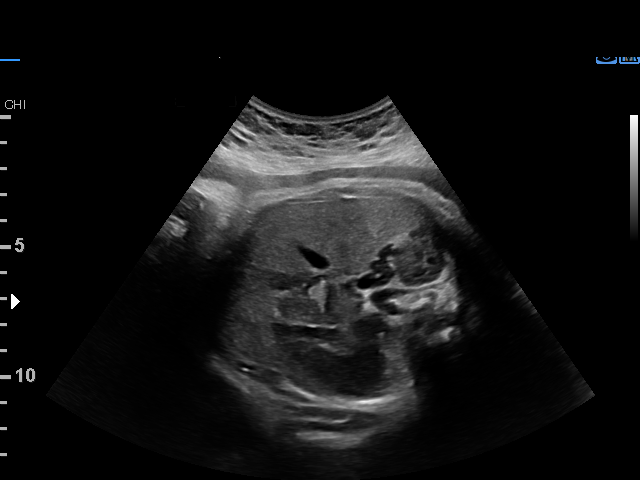
[im 31/37]
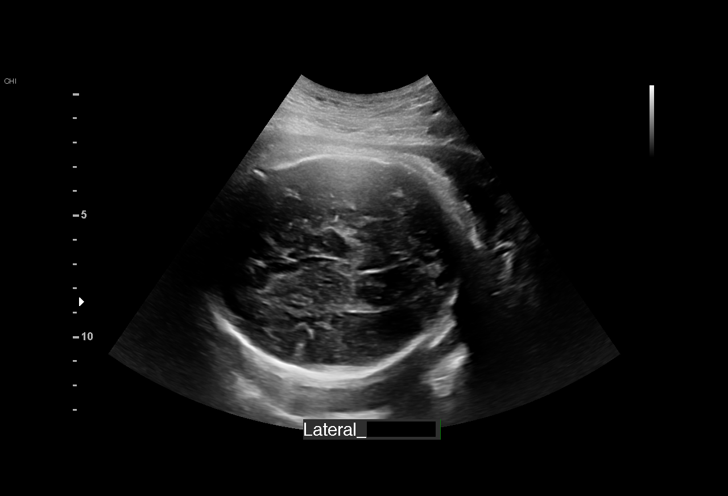
[im 34/37]
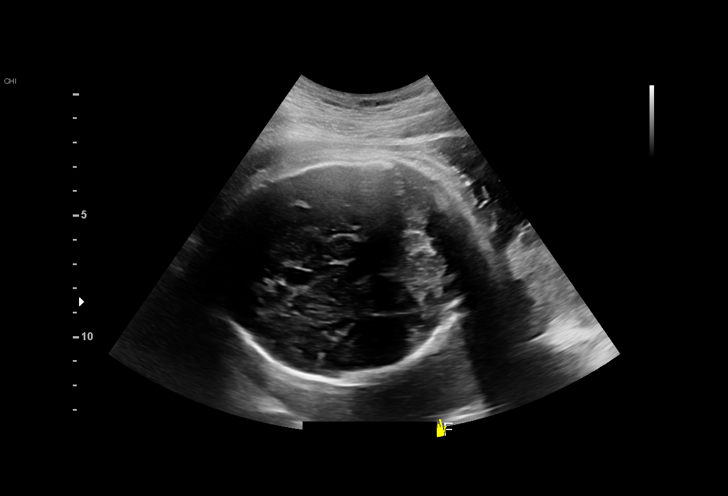
[im 37/37]
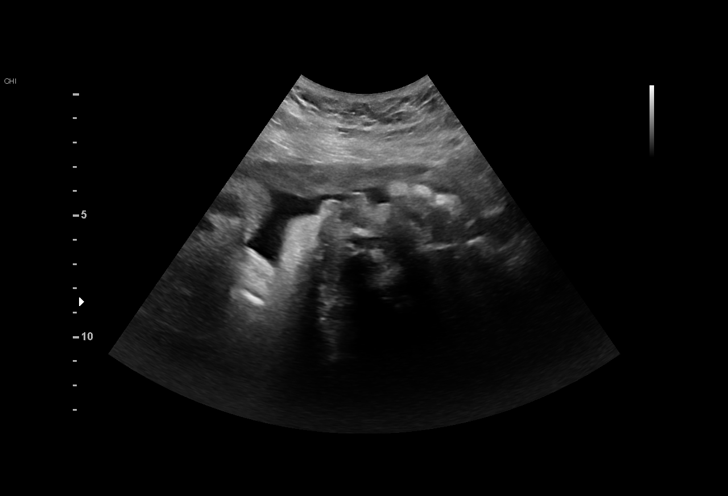

[14 of 28 positions shown; findings below may reference images not displayed]

Road [HOSPITAL]

1  GURPREET OPPENHEIMER            [id number]      [id number]     [id number]
Indications

35 weeks gestation of pregnancy
Hypertension - Chronic/Pre-existing (no
meds)
Obesity complicating pregnancy, third
trimester
OB History

Blood Type:            Height:         Weight (lb):  260.2     BMI:
Gravidity:    2         Term:   1        Prem:   0        SAB:   0
TOP:          0       Ectopic:  0        Living: 1
Fetal Evaluation

Num Of Fetuses:     1
Fetal Heart         153
Rate(bpm):
Cardiac Activity:   Observed
Presentation:       Cephalic
Placenta:           Posterior, above cervical os
P. Cord Insertion:  Previously Visualized

Amniotic Fluid
AFI FV:      Subjectively within normal limits

AFI Sum(cm)     %Tile       Largest Pocket(cm)
13.52           47          5

RUQ(cm)       RLQ(cm)       LUQ(cm)        LLQ(cm)
2.79          2.96          2.77           5
Biometry

BPD:      89.6  mm     G. Age:  36w 2d         75  %    CI:        72.06   %    70 - 86
FL/HC:      21.7   %    20.1 -
HC:      335.9  mm     G. Age:  38w 3d         84  %    HC/AC:      1.02        0.93 -
AC:      328.9  mm     G. Age:  36w 6d         87  %    FL/BPD:     81.5   %    71 - 87
FL:         73  mm     G. Age:  37w 3d         85  %    FL/AC:      22.2   %    20 - 24
HUM:      63.5  mm     G. Age:  36w 6d         88  %

Est. FW:    2874  gm    6 lb 14 oz      86  %
Gestational Age

LMP:           35w 4d        Date:  07/27/16                 EDD:   05/03/17
U/S Today:     37w 2d                                        EDD:   04/21/17
Best:          35w 4d     Det. By:  LMP  (07/27/16)          EDD:   05/03/17
Anatomy

Cranium:               Appears normal         Aortic Arch:            Previously seen
Cavum:                 Appears normal         Ductal Arch:            Previously seen
Ventricles:            Appears normal         Diaphragm:              Appears normal
Choroid Plexus:        Appears normal         Stomach:                Appears normal, left
sided
Cerebellum:            Appears normal         Abdomen:                Appears normal
Posterior Fossa:       Previously seen        Abdominal Wall:         Previously seen
Nuchal Fold:           Not applicable (>20    Cord Vessels:           Appears normal (3
wks GA)                                        vessel cord)
Face:                  Appears normal         Kidneys:                Appear normal
(orbits and profile)
Lips:                  Appears normal         Bladder:                Appears normal
Thoracic:              Appears normal         Spine:                  Previously seen
Heart:                 Previously seen        Upper Extremities:      Previously seen
RVOT:                  Previously seen        Lower Extremities:      Previously seen
LVOT:                  Appears normal

Other:  Fetus appears to be a male. Nasal bone visualized. Heels previously
visualized. Technically difficult due to maternal habitus and fetal
position.
Cervix Uterus Adnexa

Cervix
Not visualized (advanced GA >39wks)

Uterus
No abnormality visualized.

Left Ovary
Not visualized.

Right Ovary
Not visualized.

Cul De Sac:   No free fluid seen.

Adnexa:       No abnormality visualized.
Impression

SIUP at 35+4 weeks
Cephalic presentation
Normal interval anatomy; anatomic survey complete
Normal amniotic fluid volume
Appropriate interval growth with EFW at the 86th %tile
Recommendations

Follow-up as clinically indicated

## 2023-06-19 ENCOUNTER — Emergency Department (HOSPITAL_COMMUNITY)
Admission: EM | Admit: 2023-06-19 | Discharge: 2023-06-19 | Disposition: A | Discharge status: Home / Self Care | Payer: Medicaid Other | Attending: Emergency Medicine | Admitting: Emergency Medicine

## 2023-06-19 ENCOUNTER — Other Ambulatory Visit: Payer: Self-pay

## 2023-06-19 DIAGNOSIS — J45909 Unspecified asthma, uncomplicated: Secondary | ICD-10-CM | POA: Insufficient documentation

## 2023-06-19 DIAGNOSIS — I10 Essential (primary) hypertension: Secondary | ICD-10-CM | POA: Insufficient documentation

## 2023-06-19 DIAGNOSIS — Z3046 Encounter for surveillance of implantable subdermal contraceptive: Secondary | ICD-10-CM | POA: Diagnosis present

## 2023-06-19 LAB — CBC WITH DIFFERENTIAL/PLATELET
Abs Immature Granulocytes: 0.03 10*3/uL (ref 0.00–0.07)
Basophils Absolute: 0.1 10*3/uL (ref 0.0–0.1)
Basophils Relative: 1 %
Eosinophils Absolute: 0.3 10*3/uL (ref 0.0–0.5)
Eosinophils Relative: 3 %
HCT: 41.2 % (ref 36.0–46.0)
Hemoglobin: 12.6 g/dL (ref 12.0–15.0)
Immature Granulocytes: 0 %
Lymphocytes Relative: 49 %
Lymphs Abs: 3.9 10*3/uL (ref 0.7–4.0)
MCH: 23.6 pg — ABNORMAL LOW (ref 26.0–34.0)
MCHC: 30.6 g/dL (ref 30.0–36.0)
MCV: 77.3 fL — ABNORMAL LOW (ref 80.0–100.0)
Monocytes Absolute: 0.5 10*3/uL (ref 0.1–1.0)
Monocytes Relative: 6 %
Neutro Abs: 3.3 10*3/uL (ref 1.7–7.7)
Neutrophils Relative %: 41 %
Platelets: 230 10*3/uL (ref 150–400)
RBC: 5.33 MIL/uL — ABNORMAL HIGH (ref 3.87–5.11)
RDW: 16.8 % — ABNORMAL HIGH (ref 11.5–15.5)
WBC: 8.1 10*3/uL (ref 4.0–10.5)
nRBC: 0 % (ref 0.0–0.2)

## 2023-06-19 LAB — BASIC METABOLIC PANEL
Anion gap: 10 (ref 5–15)
BUN: 9 mg/dL (ref 6–20)
CO2: 27 mmol/L (ref 22–32)
Calcium: 8.5 mg/dL — ABNORMAL LOW (ref 8.9–10.3)
Chloride: 101 mmol/L (ref 98–111)
Creatinine, Ser: 0.84 mg/dL (ref 0.44–1.00)
GFR, Estimated: >60 mL/min (ref >60)
Glucose, Bld: 97 mg/dL (ref 70–99)
Potassium: 3.5 mmol/L (ref 3.5–5.1)
Sodium: 138 mmol/L (ref 135–145)

## 2023-06-19 MED ORDER — LIDOCAINE HCL (PF) 1 % IJ SOLN
5.0000 mL | Freq: Once | INTRAMUSCULAR | Status: AC
  Administered 2023-06-19: 5 mL
  Filled 2023-06-19: qty 5

## 2023-06-19 NOTE — ED Provider Triage Note (Signed)
Emergency Medicine Provider Triage Evaluation Note  Kristin Bentley , a 32 y.o. female  was evaluated in triage.  Pt complains of concerns for pain to the left upper arm where her Nexplanon is placed.  Notes that she has had it for 4 years and it was supposed to be removed at the 3-year mark.  However patient was unable to do that.  Notes that she has had pain to the area for several months.  Tried ibuprofen at home for her symptoms. Denies fever, chest pain, shortness of breath.  Denies any recent blood draws.  Review of Systems  Positive:  Negative:   Physical Exam  BP (!) 149/89 (BP Location: Left Arm)   Pulse 80   Temp 98 F (36.7 C)   Resp 18   SpO2 100%  Gen:   Awake, no distress   Resp:  Normal effort  MSK:   Moves extremities without difficulty  Other:  Nexplanon noted to left upper arm.  No surrounding erythema noted.  Mild tenderness to palpation noted to the area.  Medical Decision Making  Medically screening exam initiated at 8:18 PM.  Appropriate orders placed.  Kristin Bentley was informed that the remainder of the evaluation will be completed by another provider, this initial triage assessment does not replace that evaluation, and the importance of remaining in the ED until their evaluation is complete.  Workup initiated   Kristin Bentley A, PA-C 06/19/23 2019

## 2023-06-19 NOTE — Discharge Instructions (Signed)
Keep the dressing on for 2 to 3 days.  After that you can keep the Steri-Strips on until they come off.  Watch for signs of infection.

## 2023-06-19 NOTE — ED Triage Notes (Signed)
Patient reports increasing pain at left upper arm contraceptive implant this month.

## 2023-06-19 NOTE — ED Provider Notes (Signed)
[facility] EMERGENCY DEPARTMENT AT [facility] Provider Note   CSN: [id number] Arrival date & time: 06/19/23  1953     History  Chief Complaint  Patient presents with   Left Arm Implant Pain     Kristin Bentley is a 32 y.o. female.  HPI Patient presents with pain in left upper arm.  States it is from her Nexplanon.  States it has been in there for 4 years.  States it has been hurting her the whole time now feeling worse.  States ibuprofen does not help.   Past Medical History:  Diagnosis Date   ADHD (attention deficit hyperactivity disorder)    Anemia    Anxiety    Anxiety with depression    Asthma    Chlamydia    Gonorrhea    Hypertension     Home Medications Prior to Admission medications   Medication Sig Start Date End Date Taking? Authorizing Provider  ibuprofen (ADVIL) 200 MG tablet Take 200 mg by mouth every 6 (six) hours as needed for mild pain.   Yes [provider]  acetaminophen (TYLENOL) 325 MG tablet Take 2 tablets (650 mg total) by mouth every 4 (four) hours as needed (for pain scale < 4). Patient not taking: Reported on 06/19/2023 04/21/17   Lorne Skeens, MD  albuterol (PROVENTIL) (2.5 MG/3ML) 0.083% nebulizer solution Take 6 mLs (5 mg total) by nebulization every 6 (six) hours as needed for wheezing or shortness of breath. Patient not taking: Reported on 06/19/2023 11/19/17   Danne Harbor  FLOVENT HFA 110 MCG/ACT inhaler INHALE TWO PUFFS BY MOUTH DAILY Patient not taking: Reported on 06/19/2023 01/03/18   Brock Bad, MD  sertraline (ZOLOFT) 50 MG tablet Take 1 tablet (50 mg total) by mouth daily. Patient not taking: Reported on 03/27/2018 05/20/17   Constant, Peggy, MD      Allergies    Bee venom and Citrus    Review of Systems   Review of Systems  Physical Exam Updated Vital Signs BP 129/75 (BP Location: Left Arm)   Pulse 84   Temp 98.3 F (36.8 C) (Oral)   Resp 18   SpO2 100%  Physical Exam Vitals  and nursing note reviewed.  Musculoskeletal:     Comments: Left upper arm does have palpable Nexplanon.  No erythema.  Skin:    General: Skin is warm.  Neurological:     Mental Status: She is alert and oriented to person, place, and time.     ED Results / Procedures / Treatments   Labs (all labs ordered are listed, but only abnormal results are displayed) Labs Reviewed  BASIC METABOLIC PANEL - Abnormal; Notable for the following components:      Result Value   Calcium 8.5 (*)    All other components within normal limits  CBC WITH DIFFERENTIAL/PLATELET - Abnormal; Notable for the following components:   RBC 5.33 (*)    MCV 77.3 (*)    MCH 23.6 (*)    RDW 16.8 (*)    All other components within normal limits    EKG None  Radiology No results found.  Procedures .Foreign Body Removal  Date/Time: 06/19/2023 10:38 PM  Performed by: Benjiman Core, MD Authorized by: Benjiman Core, MD  Consent: Verbal consent obtained. Written consent not obtained. Risks and benefits: risks, benefits and alternatives were discussed Consent given by: patient Patient understanding: patient states understanding of the procedure being performed Patient consent: the patient's understanding of the procedure matches  consent given Site marked: the operative site was marked Imaging studies: imaging studies not available Patient identity confirmed: verbally with patient Intake: Removal of Nexplanon from left upper arm. Anesthesia: local infiltration  Anesthesia: Local Anesthetic: lidocaine 1% without epinephrine Anesthetic total: 1.5 mL  Sedation: Patient sedated: no  Patient restrained: no Complexity: simple 1 objects recovered. Objects recovered: Nexplanon Post-procedure assessment: foreign body removed Patient tolerance: patient tolerated the procedure well with no immediate complications Comments: Nexplanon palpated left upper arm.  Skin cleaned with chlorhexidine.  Lidocaine  injected.  Incision made with scalpel.  Nexplanon removed.  Appears to be intact.  No further palpable mass in the upper arm.  Steri-Strips to help approximate the wound.  Then pressure dressing placed.      Medications Ordered in ED Medications  lidocaine (PF) (XYLOCAINE) 1 % injection 5 mL (5 mLs Infiltration Given 06/19/23 2256)    ED Course/ Medical Decision Making/ A&P                                 Medical Decision Making Risk Prescription drug management.   Patient with painful Nexplanon left upper arm.  No swelling.  No erythema.  Doubt other cause such as DVT.  Discussed with patient and removed in ER.  Outpatient follow-up as needed.        Final Clinical Impression(s) / ED Diagnoses Final diagnoses:  Nexplanon removal    Rx / DC Orders ED Discharge Orders     None         Benjiman Core, MD 06/19/23 2338

## 2023-08-05 ENCOUNTER — Encounter (HOSPITAL_COMMUNITY): Payer: Self-pay

## 2023-08-05 ENCOUNTER — Ambulatory Visit (HOSPITAL_COMMUNITY)
Admission: EM | Admit: 2023-08-05 | Discharge: 2023-08-05 | Disposition: A | Discharge status: Home / Self Care | Payer: Medicaid Other | Attending: Internal Medicine | Admitting: Internal Medicine

## 2023-08-05 DIAGNOSIS — Z3202 Encounter for pregnancy test, result negative: Secondary | ICD-10-CM

## 2023-08-05 DIAGNOSIS — N912 Amenorrhea, unspecified: Secondary | ICD-10-CM | POA: Diagnosis not present

## 2023-08-05 LAB — POCT URINE PREGNANCY: Preg Test, Ur: NEGATIVE

## 2023-08-05 NOTE — ED Triage Notes (Signed)
Here for pregnancy test. Last menstrual was 06/30/2023.

## 2023-08-05 NOTE — ED Provider Notes (Signed)
[facility]    CSN: [id number] Arrival date & time: 08/05/23  1347      History   Chief Complaint Chief Complaint  Patient presents with   Possible Pregnancy    HPI Kristin Bentley is a 32 y.o. female.   Kristin Bentley is a 32 y.o. female presenting for chief complaint of possible pregnancy.  Her last menstrual cycle started on June 30, 2023, menstrual cycles are usually very regular.  She denies nausea, vomiting, abdominal pain, urinary symptoms, vaginal bleeding, vaginal symptoms, concern for STD.  Requesting pregnancy testing.  She and her husband are seeking to get pregnant.   Possible Pregnancy    Past Medical History:  Diagnosis Date   ADHD (attention deficit hyperactivity disorder)    Anemia    Anxiety    Anxiety with depression    Asthma    Chlamydia    Gonorrhea    Hypertension     Patient Active Problem List   Diagnosis Date Noted   Low vitamin D level 01/02/2017   HTN (hypertension) 04/16/2016   Essential hypertension 03/31/2015   Morbid obesity (HCC) 12/14/2014   Asthma, chronic 12/14/2014   Metabolic syndrome 12/14/2014   History of attention deficit hyperactivity disorder (ADHD) 12/14/2014    Past Surgical History:  Procedure Laterality Date   WISDOM TOOTH EXTRACTION      OB History     Gravida  3   Para  2   Term  2   Preterm      AB      Living  2      SAB      IAB      Ectopic      Multiple  0   Live Births  2            Home Medications    Prior to Admission medications   Medication Sig Start Date End Date Taking? Authorizing Provider  acetaminophen (TYLENOL) 325 MG tablet Take 2 tablets (650 mg total) by mouth every 4 (four) hours as needed (for pain scale < 4). Patient not taking: Reported on 06/19/2023 04/21/17   Lorne Skeens, MD  albuterol (PROVENTIL) (2.5 MG/3ML) 0.083% nebulizer solution Take 6 mLs (5 mg total) by nebulization every 6 (six) hours as needed for wheezing  or shortness of breath. Patient not taking: Reported on 06/19/2023 11/19/17   Danne Harbor  FLOVENT HFA 110 MCG/ACT inhaler INHALE TWO PUFFS BY MOUTH DAILY Patient not taking: Reported on 06/19/2023 01/03/18   Brock Bad, MD  ibuprofen (ADVIL) 200 MG tablet Take 200 mg by mouth every 6 (six) hours as needed for mild pain.    [provider]  sertraline (ZOLOFT) 50 MG tablet Take 1 tablet (50 mg total) by mouth daily. Patient not taking: Reported on 03/27/2018 05/20/17   Constant, Peggy, MD    Family History Family History  Problem Relation Age of Onset   Heart disease Mother    Diabetes Mother    Hypertension Mother    Asthma Sister    Diabetes Maternal Grandmother     Social History Social History   Tobacco Use   Smoking status: Former   Smokeless tobacco: Former  Building services engineer status: Never Used  Substance Use Topics   Alcohol use: No   Drug use: No     Allergies   Bee venom and Citrus   Review of Systems Review of Systems Per HPI  Physical Exam Triage Vital  Signs ED Triage Vitals  Encounter Vitals Group     BP 08/05/23 1505 135/84     Systolic BP Percentile --      Diastolic BP Percentile --      Pulse Rate 08/05/23 1505 92     Resp 08/05/23 1505 16     Temp 08/05/23 1505 98.6 F (37 C)     Temp Source 08/05/23 1505 Oral     SpO2 08/05/23 1505 98 %     Weight --      Height --      Head Circumference --      Peak Flow --      Pain Score 08/05/23 1516 0     Pain Loc --      Pain Education --      Exclude from Growth Chart --    No data found.  Updated Vital Signs BP 135/84 (BP Location: Left Arm)   Pulse 92   Temp 98.6 F (37 C) (Oral)   Resp 16   LMP 06/30/2023 (Approximate)   SpO2 98%   Visual Acuity Right Eye Distance:   Left Eye Distance:   Bilateral Distance:    Right Eye Near:   Left Eye Near:    Bilateral Near:     Physical Exam Vitals and nursing note reviewed.  Constitutional:      Appearance: She  is not ill-appearing or toxic-appearing.  HENT:     Head: Normocephalic and atraumatic.     Right Ear: Hearing and external ear normal.     Left Ear: Hearing and external ear normal.     Nose: Nose normal.     Mouth/Throat:     Lips: Pink.  Eyes:     General: Lids are normal. Vision grossly intact. Gaze aligned appropriately.     Extraocular Movements: Extraocular movements intact.     Conjunctiva/sclera: Conjunctivae normal.  Cardiovascular:     Rate and Rhythm: Normal rate and regular rhythm.     Heart sounds: Normal heart sounds, S1 normal and S2 normal.  Pulmonary:     Effort: Pulmonary effort is normal. No respiratory distress.     Breath sounds: Normal breath sounds and air entry.  Genitourinary:    Comments: Deferred. Musculoskeletal:     Cervical back: Neck supple.  Skin:    General: Skin is warm and dry.     Capillary Refill: Capillary refill takes less than 2 seconds.     Findings: No rash.  Neurological:     General: No focal deficit present.     Mental Status: She is alert and oriented to person, place, and time. Mental status is at baseline.     Cranial Nerves: No dysarthria or facial asymmetry.  Psychiatric:        Mood and Affect: Mood normal.        Speech: Speech normal.        Behavior: Behavior normal.        Thought Content: Thought content normal.        Judgment: Judgment normal.      UC Treatments / Results  Labs (all labs ordered are listed, but only abnormal results are displayed) Labs Reviewed  POCT URINE PREGNANCY    EKG   Radiology No results found.  Procedures Procedures (including critical care time)  Medications Ordered in UC Medications - No data to display  Initial Impression / Assessment and Plan / UC Course  I have reviewed the triage vital signs and  the nursing notes.  Pertinent labs & imaging results that were available during my care of the patient were reviewed by me and considered in my medical decision making (see  chart for details).   1.  Negative pregnancy test, amenorrhea Urine pregnancy in clinic is negative.  Patient may repeat urine pregnancy test at home in 5 to 7 days should the menstrual cycle fail to start. May follow-up with OB/GYN listed on paperwork as needed.  Counseled patient on potential for adverse effects with medications prescribed/recommended today, strict ER and return-to-clinic precautions discussed, patient verbalized understanding.    Final Clinical Impressions(s) / UC Diagnoses   Final diagnoses:  Negative pregnancy test  Amenorrhea     Discharge Instructions      Your pregnancy test was negative today.  You may repeat pregnancy test at home in 5-7 days if your menstrual cycle still hasn't started.   Center for [facility]            [address] [place], [place] [address] [phone]   Center for [facility]                                                            [address], [place], [place], [address] [phone]  Call one of the OB/GYNs listed above to schedule an appointment for follow-up.  If you develop any new or worsening symptoms or if your symptoms do not start to improve, please return here or follow-up with your primary care provider. If your symptoms are severe, please go to the emergency room.     ED Prescriptions   None    PDMP not reviewed this encounter.   Carlisle Beers, [place] 08/05/23 1559

## 2023-08-05 NOTE — Discharge Instructions (Signed)
Your pregnancy test was negative today.  You may repeat pregnancy test at home in 5-7 days if your menstrual cycle still hasn't started.   Center for [facility]            [address] [place], [place] [address] [phone]   Center for [facility]                                                            [address], [address], [place], [place], [address] [phone]  Call one of the OB/GYNs listed above to schedule an appointment for follow-up.  If you develop any new or worsening symptoms or if your symptoms do not start to improve, please return here or follow-up with your primary care provider. If your symptoms are severe, please go to the emergency room.

## 2023-08-22 NOTE — Plan of Care (Signed)
[facility] Tonsillectomy/Adenoidectomy, Postoperative PEDS care plan entered in error.

## 2023-09-09 ENCOUNTER — Ambulatory Visit (HOSPITAL_COMMUNITY)
Admission: EM | Admit: 2023-09-09 | Discharge: 2023-09-09 | Disposition: A | Discharge status: Home / Self Care | Payer: Medicaid Other | Attending: Emergency Medicine | Admitting: Emergency Medicine

## 2023-09-09 ENCOUNTER — Encounter (HOSPITAL_COMMUNITY): Payer: Self-pay

## 2023-09-09 DIAGNOSIS — K047 Periapical abscess without sinus: Secondary | ICD-10-CM | POA: Diagnosis not present

## 2023-09-09 DIAGNOSIS — Z5971 Insufficient health insurance coverage: Secondary | ICD-10-CM

## 2023-09-09 MED ORDER — PENICILLIN G BENZATHINE 1200000 UNIT/2ML IM SUSY
1.2000 10*6.[IU] | PREFILLED_SYRINGE | Freq: Once | INTRAMUSCULAR | Status: AC
  Administered 2023-09-09: 1.2 10*6.[IU] via INTRAMUSCULAR

## 2023-09-09 MED ORDER — KETOROLAC TROMETHAMINE 30 MG/ML IJ SOLN
INTRAMUSCULAR | Status: AC
  Filled 2023-09-09: qty 1

## 2023-09-09 MED ORDER — KETOROLAC TROMETHAMINE 30 MG/ML IJ SOLN
30.0000 mg | Freq: Once | INTRAMUSCULAR | Status: AC
  Administered 2023-09-09: 30 mg via INTRAMUSCULAR

## 2023-09-09 MED ORDER — ACETAMINOPHEN 500 MG PO TABS: 1000.0000 mg | ORAL_TABLET | Freq: Three times a day (TID) | ORAL | 0 refills | Status: AC

## 2023-09-09 MED ORDER — IBUPROFEN 800 MG PO TABS: 800.0000 mg | ORAL_TABLET | Freq: Three times a day (TID) | ORAL | 0 refills | Status: AC | PRN

## 2023-09-09 MED ORDER — PENICILLIN G BENZATHINE 1200000 UNIT/2ML IM SUSY
PREFILLED_SYRINGE | INTRAMUSCULAR | Status: AC
  Filled 2023-09-09: qty 2

## 2023-09-09 MED ORDER — PENICILLIN V POTASSIUM 500 MG PO TABS: 500.0000 mg | ORAL_TABLET | Freq: Three times a day (TID) | ORAL | 0 refills | Status: AC

## 2023-09-09 NOTE — Discharge Instructions (Addendum)
During your visit today, you received an injection of an antibiotic called penicillin that should provide you with some immediate relief of the swelling and infection around your right upper tooth.  You also received an injection of ketorolac for pain.  This is a very strong anti-inflammatory pain medication that should provide you with the pain relief as well.  Please read below to learn more about the medications, dosages and frequencies that I recommend to help alleviate your symptoms and to get you feeling better soon:   Penicillin V:  Please take one (1) dose three times daily for 14 days.  This antibiotic can cause upset stomach, this will resolve once antibiotics are complete.  You are welcome to take a probiotic, eat yogurt, take Imodium while taking this medication.  Please avoid other systemic medications such as Maalox, Pepto-Bismol or milk of magnesia as they can interfere with the body's ability to absorb the antibiotics.  Advil, Motrin (ibuprofen): This is a good anti-inflammatory medication which addresses aches, pains and inflammation of the upper airways that causes sinus and nasal congestion as well as in the lower airways which makes your cough feel tight and sometimes burn.  I recommend that you take between 800 mg every 8 hours as needed.  Please do not take more than 2400 mg of ibuprofen in a 24-hour period and please do not take high doses of ibuprofen for more than 3 days in a row as this can lead to stomach ulcers.   Tylenol (acetaminophen): This is a good fever reducer.  If your body temperature rises above 101.5 as measured with a thermometer, it is recommended that you take 1,000 mg every 8 hours until your temperature falls below 101.5.  Please be careful when taking Tylenol (acetaminophen) along with other medications containing acetaminophen or Tylenol.  The maximum dose of Tylenol in a 24-hour period is 3000 mg, taking more than 3000 mg per day can damage your liver.         I provided you with a list of low-cost dentists in the area.  My personal recommendation is to reach out to [facility] to schedule an appointment to have your tooth evaluated.  They take patients with Medicaid and run a very good dental program.  They have an office in [facility].  The [place] [facility] [address] [facility] also sees patients for low cost but their appointments are booking 3 to 6 months out.    Please keep in mind that while antibiotics will take care of the infection in your tooth, it will not prevent another infection from occurring.   Frequent dental infections  increase your risk of infection in the valves in your heart which may require heart surgery to fix.  It is very important that your tooth is addressed in the next 2 to 3 weeks.    If symptoms have not meaningfully improved in the next 3 to 5 days, please return for repeat evaluation or follow-up with your regular provider.  If symptoms have worsened in the next 3 to 5 days, please go to the emergency room for further evaluation.  CT scan of your head may be needed.    Thank you for visiting urgent care today.  We appreciate the opportunity to participate in your care.

## 2023-09-09 NOTE — ED Triage Notes (Signed)
Pt is here for dental abscess and pain. Pain 10/10

## 2023-09-09 NOTE — ED Provider Notes (Signed)
[facility]    CSN: [id number] Arrival date & time: 09/09/23  1134    HISTORY   Chief Complaint  Patient presents with   Dental Pain   HPI Kristin Bentley is a pleasant, 32 y.o. female who presents to urgent care today. Patient states that 4 days ago, she began to have pain and swelling of her right upper tooth which has been decayed for several years.  Patient states she has never had an infection of the tooth before.  Patient states she is does not currently have a dentist.  Patient denies fever but endorses significant swelling and significant pain which she rates 10 out of 10.  Patient has elevated temperature on arrival but is afebrile.  The history is provided by the patient.   Past Medical History:  Diagnosis Date   ADHD (attention deficit hyperactivity disorder)    Anemia    Anxiety    Anxiety with depression    Asthma    Chlamydia    Gonorrhea    Hypertension    Patient Active Problem List   Diagnosis Date Noted   Underinsured 09/09/2023   Low vitamin D level 01/02/2017   HTN (hypertension) 04/16/2016   Essential hypertension 03/31/2015   Morbid obesity (HCC) 12/14/2014   Asthma, chronic 12/14/2014   Metabolic syndrome 12/14/2014   History of attention deficit hyperactivity disorder (ADHD) 12/14/2014   Past Surgical History:  Procedure Laterality Date   WISDOM TOOTH EXTRACTION     OB History     Gravida  3   Para  2   Term  2   Preterm      AB      Living  2      SAB      IAB      Ectopic      Multiple  0   Live Births  2          Home Medications    Prior to Admission medications   Medication Sig Start Date End Date Taking? Authorizing Provider  acetaminophen (TYLENOL) 325 MG tablet Take 2 tablets (650 mg total) by mouth every 4 (four) hours as needed (for pain scale < 4). Patient not taking: Reported on 06/19/2023 04/21/17   Lorne Skeens, MD  albuterol (PROVENTIL) (2.5 MG/3ML) 0.083% nebulizer  solution Take 6 mLs (5 mg total) by nebulization every 6 (six) hours as needed for wheezing or shortness of breath. Patient not taking: Reported on 06/19/2023 11/19/17   Danne [place]  FLOVENT HFA 110 MCG/ACT inhaler INHALE TWO PUFFS BY MOUTH DAILY Patient not taking: Reported on 06/19/2023 01/03/18   Brock Bad, MD  ibuprofen (ADVIL) 200 MG tablet Take 200 mg by mouth every 6 (six) hours as needed for mild pain.    [provider]  sertraline (ZOLOFT) 50 MG tablet Take 1 tablet (50 mg total) by mouth daily. Patient not taking: Reported on 03/27/2018 05/20/17   Constant, Peggy, MD    Family History Family History  Problem Relation Age of Onset   Heart disease Mother    Diabetes Mother    Hypertension Mother    Asthma Sister    Diabetes Maternal Grandmother    Social History Social History   Tobacco Use   Smoking status: Former   Smokeless tobacco: Former  Building services engineer status: Never Used  Substance Use Topics   Alcohol use: No   Drug use: No   Allergies   Bee venom and  Citrus  Review of Systems Review of Systems Pertinent findings revealed after performing a 14 point review of systems has been noted in the history of present illness.  Physical Exam Vital Signs BP (!) 153/95 (BP Location: Left Arm)   Pulse 67   Temp 99.1 F (37.3 C) (Oral)   Resp 16   LMP 09/08/2023   SpO2 98%   No data found.  Physical Exam Vitals and nursing note reviewed.  Constitutional:      General: She is awake. She is not in acute distress.    Appearance: Normal appearance. She is well-developed and well-groomed. She is ill-appearing.  HENT:     Head: Normocephalic and atraumatic.      Mouth/Throat:     Lips: Pink.     Mouth: Mucous membranes are moist.     Dentition: Gingival swelling and dental caries present.   Eyes:     Pupils: Pupils are equal, round, and reactive to light.  Cardiovascular:     Rate and Rhythm: Normal rate and regular rhythm.   Pulmonary:     Effort: Pulmonary effort is normal.     Breath sounds: Normal breath sounds.  Musculoskeletal:        General: Normal range of motion.     Cervical back: Normal range of motion and neck supple.  Skin:    General: Skin is warm and dry.  Neurological:     General: No focal deficit present.     Mental Status: She is alert and oriented to person, place, and time. Mental status is at baseline.  Psychiatric:        Mood and Affect: Mood normal.        Behavior: Behavior normal. Behavior is cooperative.        Thought Content: Thought content normal.        Judgment: Judgment normal.     Visual Acuity Right Eye Distance:   Left Eye Distance:   Bilateral Distance:    Right Eye Near:   Left Eye Near:    Bilateral Near:     UC Couse / Diagnostics / Procedures:     Radiology No results found.  Procedures Procedures (including critical care time) EKG  Pending results:  Labs Reviewed - No data to display  Medications Ordered in UC: Medications  penicillin g benzathine (BICILLIN LA) 1200000 UNIT/2ML injection 1.2 Million Units (has no administration in time range)  ketorolac (TORADOL) 30 MG/ML injection 30 mg (has no administration in time range)    UC Diagnoses / Final Clinical Impressions(s)   I have reviewed the triage vital signs and the nursing notes.  Pertinent labs & imaging results that were available during my care of the patient were reviewed by me and considered in my medical decision making (see chart for details).    Final diagnoses:  Dental abscess  Underinsured   Patient provided with Bicillin LA IM to aggressively treat dental abscess as well as ketorolac IM for pain during her visit.  Patient provided with a 14-day course of penicillin p.o. for continued resolution of dental infection and ibuprofen 100 mg to take along with Tylenol 1000 mg 3 times daily for pain relief.  Patient was strongly encouraged to reach out to her dentist to schedule  appointment within the next few weeks to have the tooth addressed as this will certainly recur if this is not done.  Patient provided with a list of low-cost dental health providers.  Please see discharge instructions below for  details of plan of care as provided to patient. ED Prescriptions     Medication Sig Dispense Auth. Provider   ibuprofen (ADVIL) 800 MG tablet Take 1 tablet (800 mg total) by mouth every 8 (eight) hours as needed for up to 14 days. 42 tablet Theadora Rama Scales, PA-C   acetaminophen (TYLENOL) 500 MG tablet Take 2 tablets (1,000 mg total) by mouth every 8 (eight) hours for 14 days. 84 tablet Theadora Rama Scales, PA-C   penicillin v potassium (VEETID) 500 MG tablet Take 1 tablet (500 mg total) by mouth 3 (three) times daily for 14 days. 42 tablet Theadora Rama Scales, PA-C      PDMP not reviewed this encounter.  Pending results:  Labs Reviewed - No data to display  Discharge Instructions:   Discharge Instructions      During your visit today, you received an injection of an antibiotic called penicillin that should provide you with some immediate relief of the swelling and infection around your right upper tooth.  You also received an injection of ketorolac for pain.  This is a very strong anti-inflammatory pain medication that should provide you with the pain relief as well.  Please read below to learn more about the medications, dosages and frequencies that I recommend to help alleviate your symptoms and to get you feeling better soon:   Penicillin V:  Please take one (1) dose three times daily for 14 days.  This antibiotic can cause upset stomach, this will resolve once antibiotics are complete.  You are welcome to take a probiotic, eat yogurt, take Imodium while taking this medication.  Please avoid other systemic medications such as Maalox, Pepto-Bismol or milk of magnesia as they can interfere with the body's ability to absorb the antibiotics.  Advil,  Motrin (ibuprofen): This is a good anti-inflammatory medication which addresses aches, pains and inflammation of the upper airways that causes sinus and nasal congestion as well as in the lower airways which makes your cough feel tight and sometimes burn.  I recommend that you take between 800 mg every 8 hours as needed.  Please do not take more than 2400 mg of ibuprofen in a 24-hour period and please do not take high doses of ibuprofen for more than 3 days in a row as this can lead to stomach ulcers.   Tylenol (acetaminophen): This is a good fever reducer.  If your body temperature rises above 101.5 as measured with a thermometer, it is recommended that you take 1,000 mg every 8 hours until your temperature falls below 101.5.  Please be careful when taking Tylenol (acetaminophen) along with other medications containing acetaminophen or Tylenol.  The maximum dose of Tylenol in a 24-hour period is 3000 mg, taking more than 3000 mg per day can damage your liver.        I provided you with a list of low-cost dentists in the area.  My personal recommendation is to reach out to [facility] to schedule an appointment to have your tooth evaluated.  They take patients with Medicaid and run a very good dental program.  They have an office in [facility].  The [place] [facility] [address] [facility] also sees patients for low cost but their appointments are booking 3 to 6 months out.    Please keep in mind that while antibiotics will take care of the infection in your tooth, it will not prevent another infection from occurring.   Frequent dental infections  increase your risk of infection in the valves in your heart which may require heart surgery to fix.  It is very important that your tooth is addressed in the next 2 to 3 weeks.    If symptoms have not meaningfully improved in the next 3 to 5 days, please return for repeat evaluation or follow-up with your regular  provider.  If symptoms have worsened in the next 3 to 5 days, please go to the emergency room for further evaluation.  CT scan of your head may be needed.    Thank you for visiting urgent care today.  We appreciate the opportunity to participate in your care.       Disposition Upon Discharge:  Condition: stable for discharge home  Patient presented with an acute illness with associated systemic symptoms and significant discomfort requiring urgent management. In my opinion, this is a condition that a prudent lay person (someone who possesses an average knowledge of health and medicine) may potentially expect to result in complications if not addressed urgently such as respiratory distress, impairment of bodily function or dysfunction of bodily organs.   Routine symptom specific, illness specific and/or disease specific instructions were discussed with the patient and/or caregiver at length.   As such, the patient has been evaluated and assessed, work-up was performed and treatment was provided in alignment with urgent care protocols and evidence based medicine.  Patient/parent/caregiver has been advised that the patient may require follow up for further testing and treatment if the symptoms continue in spite of treatment, as clinically indicated and appropriate.  Patient/parent/caregiver has been advised to return to the [facility] or PCP if no better; to PCP or the Emergency Department if new signs and symptoms develop, or if the current signs or symptoms continue to change or worsen for further workup, evaluation and treatment as clinically indicated and appropriate  The patient will follow up with their current PCP if and as advised. If the patient does not currently have a PCP we will assist them in obtaining one.   The patient may need specialty follow up if the symptoms continue, in spite of conservative treatment and management, for further workup, evaluation, consultation and treatment as  clinically indicated and appropriate.  Patient/parent/caregiver verbalized understanding and agreement of plan as discussed.  All questions were addressed during visit.  Please see discharge instructions below for further details of plan.  This office note has been dictated using Teaching laboratory technician.  Unfortunately, this method of dictation can sometimes lead to typographical or grammatical errors.  I apologize for your inconvenience in advance if this occurs.  Please do not hesitate to reach out to me if clarification is needed.      Theadora Rama Scales, PA-C 09/09/23 1354
